# Patient Record
Sex: Female | Born: 1970
Health system: Southern US, Community
[De-identification: ages and names within clinical notes are randomized; demographics above are authoritative.]

## PROBLEM LIST (undated history)

## (undated) DIAGNOSIS — D219 Benign neoplasm of connective and other soft tissue, unspecified: Secondary | ICD-10-CM

## (undated) DIAGNOSIS — I10 Essential (primary) hypertension: Secondary | ICD-10-CM

## (undated) HISTORY — PX: HERNIA REPAIR: SHX51

## (undated) HISTORY — DX: Essential (primary) hypertension: I10

## (undated) HISTORY — PX: TUBAL LIGATION: SHX77

## (undated) HISTORY — DX: Benign neoplasm of connective and other soft tissue, unspecified: D21.9

---

## 2004-09-28 ENCOUNTER — Ambulatory Visit: Payer: Self-pay | Admitting: Unknown Physician Specialty

## 2007-10-28 ENCOUNTER — Ambulatory Visit: Payer: Self-pay | Admitting: Unknown Physician Specialty

## 2007-11-06 ENCOUNTER — Ambulatory Visit: Payer: Self-pay | Admitting: Unknown Physician Specialty

## 2009-05-13 ENCOUNTER — Encounter: Admission: RE | Admit: 2009-05-13 | Discharge: 2009-05-13 | Payer: Self-pay | Admitting: Unknown Physician Specialty

## 2009-12-15 ENCOUNTER — Encounter: Payer: Self-pay | Admitting: Obstetrics and Gynecology

## 2010-05-15 ENCOUNTER — Encounter: Admission: RE | Admit: 2010-05-15 | Discharge: 2010-05-15 | Payer: Self-pay | Admitting: Unknown Physician Specialty

## 2011-01-18 ENCOUNTER — Observation Stay: Payer: Self-pay

## 2011-01-21 ENCOUNTER — Observation Stay: Payer: Self-pay | Admitting: Obstetrics and Gynecology

## 2011-01-29 ENCOUNTER — Inpatient Hospital Stay: Payer: Self-pay | Admitting: Obstetrics and Gynecology

## 2011-02-01 LAB — PATHOLOGY REPORT

## 2011-04-10 ENCOUNTER — Other Ambulatory Visit: Payer: Self-pay | Admitting: Unknown Physician Specialty

## 2011-04-10 DIAGNOSIS — Z1231 Encounter for screening mammogram for malignant neoplasm of breast: Secondary | ICD-10-CM

## 2011-05-17 ENCOUNTER — Ambulatory Visit
Admission: RE | Admit: 2011-05-17 | Discharge: 2011-05-17 | Disposition: A | Discharge status: Home / Self Care | Payer: BC Managed Care – PPO | Source: Ambulatory Visit | Attending: Unknown Physician Specialty | Admitting: Unknown Physician Specialty

## 2011-05-17 DIAGNOSIS — Z1231 Encounter for screening mammogram for malignant neoplasm of breast: Secondary | ICD-10-CM

## 2011-05-24 ENCOUNTER — Other Ambulatory Visit: Payer: Self-pay | Admitting: Unknown Physician Specialty

## 2011-05-24 DIAGNOSIS — R928 Other abnormal and inconclusive findings on diagnostic imaging of breast: Secondary | ICD-10-CM

## 2011-06-06 ENCOUNTER — Ambulatory Visit
Admission: RE | Admit: 2011-06-06 | Discharge: 2011-06-06 | Disposition: A | Discharge status: Home / Self Care | Payer: BC Managed Care – PPO | Source: Ambulatory Visit | Attending: Unknown Physician Specialty | Admitting: Unknown Physician Specialty

## 2011-06-06 DIAGNOSIS — R928 Other abnormal and inconclusive findings on diagnostic imaging of breast: Secondary | ICD-10-CM

## 2012-04-28 ENCOUNTER — Other Ambulatory Visit: Payer: Self-pay | Admitting: Unknown Physician Specialty

## 2012-04-28 DIAGNOSIS — Z1231 Encounter for screening mammogram for malignant neoplasm of breast: Secondary | ICD-10-CM

## 2012-05-19 ENCOUNTER — Ambulatory Visit
Admission: RE | Admit: 2012-05-19 | Discharge: 2012-05-19 | Disposition: A | Discharge status: Home / Self Care | Payer: BC Managed Care – PPO | Source: Ambulatory Visit | Attending: Unknown Physician Specialty | Admitting: Unknown Physician Specialty

## 2012-05-19 DIAGNOSIS — Z1231 Encounter for screening mammogram for malignant neoplasm of breast: Secondary | ICD-10-CM

## 2013-03-24 ENCOUNTER — Other Ambulatory Visit: Payer: Self-pay

## 2013-03-24 DIAGNOSIS — Z1231 Encounter for screening mammogram for malignant neoplasm of breast: Secondary | ICD-10-CM

## 2013-05-20 ENCOUNTER — Ambulatory Visit
Admission: RE | Admit: 2013-05-20 | Discharge: 2013-05-20 | Disposition: A | Discharge status: Home / Self Care | Payer: BC Managed Care – PPO | Source: Ambulatory Visit

## 2013-05-20 DIAGNOSIS — Z1231 Encounter for screening mammogram for malignant neoplasm of breast: Secondary | ICD-10-CM

## 2014-04-14 ENCOUNTER — Other Ambulatory Visit: Payer: Self-pay

## 2014-04-14 DIAGNOSIS — Z1231 Encounter for screening mammogram for malignant neoplasm of breast: Secondary | ICD-10-CM

## 2014-05-25 ENCOUNTER — Ambulatory Visit
Admission: RE | Admit: 2014-05-25 | Discharge: 2014-05-25 | Disposition: A | Discharge status: Home / Self Care | Payer: BC Managed Care – PPO | Source: Ambulatory Visit

## 2014-05-25 DIAGNOSIS — Z1231 Encounter for screening mammogram for malignant neoplasm of breast: Secondary | ICD-10-CM

## 2015-04-22 ENCOUNTER — Other Ambulatory Visit: Payer: Self-pay

## 2015-04-22 DIAGNOSIS — Z1231 Encounter for screening mammogram for malignant neoplasm of breast: Secondary | ICD-10-CM

## 2015-05-30 ENCOUNTER — Ambulatory Visit
Admission: RE | Admit: 2015-05-30 | Discharge: 2015-05-30 | Disposition: A | Discharge status: Home / Self Care | Payer: BLUE CROSS/BLUE SHIELD | Source: Ambulatory Visit

## 2015-05-30 DIAGNOSIS — Z1231 Encounter for screening mammogram for malignant neoplasm of breast: Secondary | ICD-10-CM

## 2015-06-01 ENCOUNTER — Other Ambulatory Visit: Payer: Self-pay | Admitting: Internal Medicine

## 2015-06-01 DIAGNOSIS — R928 Other abnormal and inconclusive findings on diagnostic imaging of breast: Secondary | ICD-10-CM

## 2015-06-03 ENCOUNTER — Other Ambulatory Visit: Payer: Self-pay | Admitting: Unknown Physician Specialty

## 2015-06-03 DIAGNOSIS — R928 Other abnormal and inconclusive findings on diagnostic imaging of breast: Secondary | ICD-10-CM

## 2015-06-07 ENCOUNTER — Other Ambulatory Visit: Payer: BLUE CROSS/BLUE SHIELD

## 2015-06-28 ENCOUNTER — Ambulatory Visit
Admission: RE | Admit: 2015-06-28 | Discharge: 2015-06-28 | Disposition: A | Discharge status: Home / Self Care | Payer: BLUE CROSS/BLUE SHIELD | Source: Ambulatory Visit | Attending: Unknown Physician Specialty | Admitting: Unknown Physician Specialty

## 2015-06-28 DIAGNOSIS — R928 Other abnormal and inconclusive findings on diagnostic imaging of breast: Secondary | ICD-10-CM

## 2015-11-22 ENCOUNTER — Other Ambulatory Visit: Payer: Self-pay | Admitting: Unknown Physician Specialty

## 2015-11-22 DIAGNOSIS — N6320 Unspecified lump in the left breast, unspecified quadrant: Secondary | ICD-10-CM

## 2015-12-27 ENCOUNTER — Other Ambulatory Visit: Payer: BLUE CROSS/BLUE SHIELD

## 2016-01-04 ENCOUNTER — Ambulatory Visit
Admission: RE | Admit: 2016-01-04 | Discharge: 2016-01-04 | Disposition: A | Discharge status: Home / Self Care | Payer: BLUE CROSS/BLUE SHIELD | Source: Ambulatory Visit | Attending: Unknown Physician Specialty | Admitting: Unknown Physician Specialty

## 2016-01-04 DIAGNOSIS — R928 Other abnormal and inconclusive findings on diagnostic imaging of breast: Secondary | ICD-10-CM | POA: Diagnosis not present

## 2016-01-04 DIAGNOSIS — N6320 Unspecified lump in the left breast, unspecified quadrant: Secondary | ICD-10-CM

## 2016-01-04 DIAGNOSIS — N63 Unspecified lump in breast: Secondary | ICD-10-CM | POA: Diagnosis not present

## 2016-03-28 DIAGNOSIS — Z01419 Encounter for gynecological examination (general) (routine) without abnormal findings: Secondary | ICD-10-CM | POA: Diagnosis not present

## 2016-04-20 DIAGNOSIS — J029 Acute pharyngitis, unspecified: Secondary | ICD-10-CM | POA: Diagnosis not present

## 2016-05-07 ENCOUNTER — Other Ambulatory Visit: Payer: Self-pay | Admitting: Obstetrics & Gynecology

## 2016-05-07 DIAGNOSIS — N6320 Unspecified lump in the left breast, unspecified quadrant: Secondary | ICD-10-CM

## 2016-05-30 ENCOUNTER — Other Ambulatory Visit: Payer: BLUE CROSS/BLUE SHIELD

## 2016-06-12 ENCOUNTER — Ambulatory Visit
Admission: RE | Admit: 2016-06-12 | Discharge: 2016-06-12 | Disposition: A | Discharge status: Home / Self Care | Payer: BLUE CROSS/BLUE SHIELD | Source: Ambulatory Visit | Attending: Obstetrics & Gynecology | Admitting: Obstetrics & Gynecology

## 2016-06-12 DIAGNOSIS — N6320 Unspecified lump in the left breast, unspecified quadrant: Secondary | ICD-10-CM

## 2016-06-12 DIAGNOSIS — N6012 Diffuse cystic mastopathy of left breast: Secondary | ICD-10-CM | POA: Diagnosis not present

## 2016-10-11 DIAGNOSIS — D485 Neoplasm of uncertain behavior of skin: Secondary | ICD-10-CM | POA: Diagnosis not present

## 2016-10-11 DIAGNOSIS — D225 Melanocytic nevi of trunk: Secondary | ICD-10-CM | POA: Diagnosis not present

## 2016-10-11 DIAGNOSIS — D0359 Melanoma in situ of other part of trunk: Secondary | ICD-10-CM | POA: Diagnosis not present

## 2016-10-29 DIAGNOSIS — L905 Scar conditions and fibrosis of skin: Secondary | ICD-10-CM | POA: Diagnosis not present

## 2016-10-29 DIAGNOSIS — D0359 Melanoma in situ of other part of trunk: Secondary | ICD-10-CM | POA: Diagnosis not present

## 2017-02-08 DIAGNOSIS — Z85820 Personal history of malignant melanoma of skin: Secondary | ICD-10-CM | POA: Diagnosis not present

## 2017-03-15 DIAGNOSIS — D0372 Melanoma in situ of left lower limb, including hip: Secondary | ICD-10-CM | POA: Diagnosis not present

## 2017-03-15 DIAGNOSIS — D485 Neoplasm of uncertain behavior of skin: Secondary | ICD-10-CM | POA: Diagnosis not present

## 2017-04-01 ENCOUNTER — Encounter: Payer: Self-pay | Admitting: Obstetrics & Gynecology

## 2017-04-01 ENCOUNTER — Ambulatory Visit (INDEPENDENT_AMBULATORY_CARE_PROVIDER_SITE_OTHER): Payer: BLUE CROSS/BLUE SHIELD | Admitting: Obstetrics & Gynecology

## 2017-04-01 ENCOUNTER — Ambulatory Visit: Payer: Self-pay | Admitting: Obstetrics & Gynecology

## 2017-04-01 VITALS — BP 138/88 | Ht 66.0 in | Wt 170.0 lb

## 2017-04-01 DIAGNOSIS — Z803 Family history of malignant neoplasm of breast: Secondary | ICD-10-CM | POA: Diagnosis not present

## 2017-04-01 DIAGNOSIS — Z01419 Encounter for gynecological examination (general) (routine) without abnormal findings: Secondary | ICD-10-CM | POA: Diagnosis not present

## 2017-04-01 DIAGNOSIS — Z0000 Encounter for general adult medical examination without abnormal findings: Secondary | ICD-10-CM

## 2017-04-01 DIAGNOSIS — Z8041 Family history of malignant neoplasm of ovary: Secondary | ICD-10-CM | POA: Diagnosis not present

## 2017-04-01 NOTE — Progress Notes (Signed)
   HPI:      [name] is a 46 y.o. No obstetric history on file. who LMP was Patient's last menstrual period was 03/27/2017., she presents today for her annual examination. The patient has no complaints today. The patient is sexually active. Her last pap: approximate date 2016 and was normal and last mammogram: 12/2016 and 06/2016 being followed for slight abnormalities. The patient does perform self breast exams.  There is no notable family history of breast or ovarian cancer in her family.  The patient has regular exercise: yes.  The patient denies current symptoms of depression.    GYN History: Contraception: tubal ligation  PMHx: No past medical history on file. No past surgical history on file. Family History  Problem Relation Age of Onset  . Breast cancer Mother 95  . Ovarian cancer Paternal Aunt 40   Social History  Substance Use Topics  . Smoking status: Never Smoker  . Smokeless tobacco: Never Used  . Alcohol use Not on file   No current outpatient prescriptions on file. Allergies: Codeine  ROS  Objective: BP 138/88   Ht 5\' 6"  (1.676 m)   Wt 170 lb (77.1 kg)   LMP 03/27/2017   BMI 27.44 kg/m   Filed Weights   04/01/17 1550  Weight: 170 lb (77.1 kg)   Body mass index is 27.44 kg/m. OBGyn Exam  Assessment:  ANNUAL EXAM 1. Annual physical exam   2. Family history of breast cancer   3. Family history of ovarian cancer      Screening Plan:            1.  Cervical Screening-  Pap smear schedule reviewed with patient  2. Breast screening- Exam annually and mammogram>40 planned   3. Colonoscopy every 10 years, Hemoccult testing - after age 14  4. Labs due 2020  5. Counseling for contraception: bilateral tubal ligation  Other:  1. Family history of breast cancer MMG per breast center due to f/u for mast years abnormality  2. Family history of ovarian cancer     F/U  Return in about 1 year (around 04/01/2018) for Annual.  Cassidy Applebaum, MD,  [facility], [facility] 04/01/2017  4:16 PM

## 2017-04-01 NOTE — Patient Instructions (Signed)
PAP every three years Mammogram every year Labs every three years

## 2017-04-30 ENCOUNTER — Other Ambulatory Visit: Payer: Self-pay | Admitting: Obstetrics & Gynecology

## 2017-04-30 DIAGNOSIS — Z1231 Encounter for screening mammogram for malignant neoplasm of breast: Secondary | ICD-10-CM

## 2017-05-29 DIAGNOSIS — L905 Scar conditions and fibrosis of skin: Secondary | ICD-10-CM | POA: Diagnosis not present

## 2017-05-29 DIAGNOSIS — D0372 Melanoma in situ of left lower limb, including hip: Secondary | ICD-10-CM | POA: Diagnosis not present

## 2017-06-14 ENCOUNTER — Ambulatory Visit
Admission: RE | Admit: 2017-06-14 | Discharge: 2017-06-14 | Disposition: A | Discharge status: Home / Self Care | Payer: BLUE CROSS/BLUE SHIELD | Source: Ambulatory Visit | Attending: Obstetrics & Gynecology | Admitting: Obstetrics & Gynecology

## 2017-06-14 DIAGNOSIS — Z1231 Encounter for screening mammogram for malignant neoplasm of breast: Secondary | ICD-10-CM | POA: Diagnosis not present

## 2017-07-20 DIAGNOSIS — Z23 Encounter for immunization: Secondary | ICD-10-CM | POA: Diagnosis not present

## 2017-08-29 DIAGNOSIS — Z85820 Personal history of malignant melanoma of skin: Secondary | ICD-10-CM | POA: Diagnosis not present

## 2018-01-29 ENCOUNTER — Encounter: Payer: Self-pay | Admitting: Obstetrics & Gynecology

## 2018-04-04 ENCOUNTER — Other Ambulatory Visit (HOSPITAL_COMMUNITY)
Admission: RE | Admit: 2018-04-04 | Discharge: 2018-04-04 | Disposition: A | Discharge status: Home / Self Care | Payer: BLUE CROSS/BLUE SHIELD | Source: Ambulatory Visit | Attending: Obstetrics & Gynecology | Admitting: Obstetrics & Gynecology

## 2018-04-04 ENCOUNTER — Ambulatory Visit (INDEPENDENT_AMBULATORY_CARE_PROVIDER_SITE_OTHER): Payer: BLUE CROSS/BLUE SHIELD | Admitting: Obstetrics & Gynecology

## 2018-04-04 ENCOUNTER — Ambulatory Visit: Payer: BLUE CROSS/BLUE SHIELD | Admitting: Obstetrics & Gynecology

## 2018-04-04 ENCOUNTER — Other Ambulatory Visit: Payer: Self-pay | Admitting: Obstetrics & Gynecology

## 2018-04-04 ENCOUNTER — Encounter: Payer: Self-pay | Admitting: Obstetrics & Gynecology

## 2018-04-04 VITALS — BP 130/80 | Ht 66.0 in | Wt 170.0 lb

## 2018-04-04 DIAGNOSIS — Z1329 Encounter for screening for other suspected endocrine disorder: Secondary | ICD-10-CM

## 2018-04-04 DIAGNOSIS — Z124 Encounter for screening for malignant neoplasm of cervix: Secondary | ICD-10-CM | POA: Insufficient documentation

## 2018-04-04 DIAGNOSIS — Z0000 Encounter for general adult medical examination without abnormal findings: Secondary | ICD-10-CM

## 2018-04-04 DIAGNOSIS — Z1321 Encounter for screening for nutritional disorder: Secondary | ICD-10-CM

## 2018-04-04 DIAGNOSIS — Z131 Encounter for screening for diabetes mellitus: Secondary | ICD-10-CM

## 2018-04-04 DIAGNOSIS — Z1239 Encounter for other screening for malignant neoplasm of breast: Secondary | ICD-10-CM

## 2018-04-04 DIAGNOSIS — Z1231 Encounter for screening mammogram for malignant neoplasm of breast: Secondary | ICD-10-CM | POA: Diagnosis not present

## 2018-04-04 DIAGNOSIS — Z13220 Encounter for screening for lipoid disorders: Secondary | ICD-10-CM | POA: Diagnosis not present

## 2018-04-04 MED ORDER — MELOXICAM 7.5 MG PO TABS: 7.5000 mg | ORAL_TABLET | Freq: Every day | ORAL | 6 refills | Status: DC

## 2018-04-04 MED ORDER — MELOXICAM 7.5 MG PO TABS: 7.5000 mg | ORAL_TABLET | Freq: Four times a day (QID) | ORAL | 6 refills | Status: DC | PRN

## 2018-04-04 NOTE — Patient Instructions (Addendum)
PAP every three years Mammogram every year    Due NOV at [facility] soon

## 2018-04-04 NOTE — Progress Notes (Signed)
HPI:      [name] is a 47 y.o. [id number] who LMP was Patient's last menstrual period was 03/25/2018., she presents today for her annual examination. The patient has no complaints today.  She has a one day premenstrual headache w no assoc sx's. The patient is sexually active. Her last pap: approximate date 2016 and was normal and last mammogram: was normal. The patient does perform self breast exams.  There is no notable family history of breast or ovarian cancer in her family.  The patient has regular exercise: yes.  The patient denies current symptoms of depression.    GYN History: Contraception: tubal ligation  PMHx: Past Medical History:  Diagnosis Date  . Fibroids   . Hypertension    Past Surgical History:  Procedure Laterality Date  . CESAREAN SECTION    . HERNIA REPAIR    . TUBAL LIGATION     Family History  Problem Relation Age of Onset  . Breast cancer Mother 20  . Ovarian cancer Paternal Aunt 59   Social History   Tobacco Use  . Smoking status: Never Smoker  . Smokeless tobacco: Never Used  Substance Use Topics  . Alcohol use: Never    Frequency: Never  . Drug use: Never   No current outpatient medications on file. Allergies: Codeine  Review of Systems  Constitutional: Negative for chills, fever and malaise/fatigue.  HENT: Negative for congestion, sinus pain and sore throat.   Eyes: Negative for blurred vision and pain.  Respiratory: Negative for cough and wheezing.   Cardiovascular: Negative for chest pain and leg swelling.  Gastrointestinal: Negative for abdominal pain, constipation, diarrhea, heartburn, nausea and vomiting.  Genitourinary: Negative for dysuria, frequency, hematuria and urgency.  Musculoskeletal: Negative for back pain, joint pain, myalgias and neck pain.  Skin: Negative for itching and rash.  Neurological: Negative for dizziness, tremors and weakness.  Endo/Heme/Allergies: Does not bruise/bleed easily.  Psychiatric/Behavioral:  Negative for depression. The patient is not nervous/anxious and does not have insomnia.     Objective: BP 130/80   Ht 5\' 6"  (1.676 m)   Wt 170 lb (77.1 kg)   LMP 03/25/2018   BMI 27.44 kg/m   Filed Weights   04/04/18 1322  Weight: 170 lb (77.1 kg)   Body mass index is 27.44 kg/m. Physical Exam  Constitutional: She is oriented to person, place, and time. She appears well-developed and well-nourished. No distress.  Genitourinary: Rectum normal, vagina normal and uterus normal. Pelvic exam was performed with patient supine. There is no rash or lesion on the right labia. There is no rash or lesion on the left labia. Vagina exhibits no lesion. No bleeding in the vagina. Right adnexum does not display mass and does not display tenderness. Left adnexum does not display mass and does not display tenderness. Cervix does not exhibit motion tenderness, lesion, friability or polyp.   Uterus is mobile and midaxial. Uterus is not enlarged or exhibiting a mass.  HENT:  Head: Normocephalic and atraumatic. Head is without laceration.  Right Ear: Hearing normal.  Left Ear: Hearing normal.  Nose: No epistaxis.  No foreign bodies.  Mouth/Throat: Uvula is midline, oropharynx is clear and moist and mucous membranes are normal.  Eyes: Pupils are equal, round, and reactive to light.  Neck: Normal range of motion. Neck supple. No thyromegaly present.  Cardiovascular: Normal rate and regular rhythm. Exam reveals no gallop and no friction rub.  No murmur heard. Pulmonary/Chest: Effort normal and breath sounds  normal. No respiratory distress. She has no wheezes. Right breast exhibits no mass, no skin change and no tenderness. Left breast exhibits no mass, no skin change and no tenderness.  Abdominal: Soft. Bowel sounds are normal. She exhibits no distension. There is no tenderness. There is no rebound.  Musculoskeletal: Normal range of motion.  Neurological: She is alert and oriented to person, place, and time.  No cranial nerve deficit.  Skin: Skin is warm and dry.  Psychiatric: She has a normal mood and affect. Judgment normal.  Vitals reviewed.   Assessment:  ANNUAL EXAM 1. Annual physical exam   2. Screening for cervical cancer   3. Screening for breast cancer   4. Screening for cholesterol level   5. Screening for thyroid disorder   6. Screening for diabetes mellitus   7. Encounter for vitamin deficiency screening    Screening Plan:            1.  Cervical Screening-  Pap smear done today  2. Breast screening- Exam annually and mammogram>40 planned   3. Colonoscopy every 10 years, Hemoccult testing - after age 40  4. Labs To return fasting at a later date  5. Counseling for contraception: bilateral tubal ligation   6. Premenstrual Headache.  NSAID or Rx Mobic (Rx today). Hormonal options discussed.    F/U  Return in about 1 year (around 04/05/2019) for Annual.  Barnett Applebaum, MD, [facility], [facility] 04/04/2018  1:41 PM

## 2018-04-08 ENCOUNTER — Encounter: Payer: Self-pay | Admitting: Obstetrics and Gynecology

## 2018-04-09 LAB — CYTOLOGY - PAP
Diagnosis: NEGATIVE
HPV (WINDOPATH): NOT DETECTED

## 2018-04-11 ENCOUNTER — Other Ambulatory Visit: Payer: BLUE CROSS/BLUE SHIELD

## 2018-04-11 ENCOUNTER — Other Ambulatory Visit: Payer: Self-pay | Admitting: Obstetrics & Gynecology

## 2018-04-11 DIAGNOSIS — Z1321 Encounter for screening for nutritional disorder: Secondary | ICD-10-CM | POA: Diagnosis not present

## 2018-04-11 DIAGNOSIS — Z1329 Encounter for screening for other suspected endocrine disorder: Secondary | ICD-10-CM

## 2018-04-11 DIAGNOSIS — Z13220 Encounter for screening for lipoid disorders: Secondary | ICD-10-CM

## 2018-04-11 DIAGNOSIS — Z131 Encounter for screening for diabetes mellitus: Secondary | ICD-10-CM

## 2018-04-12 LAB — LIPID PANEL
Chol/HDL Ratio: 2.7 ratio (ref 0.0–4.4)
Cholesterol, Total: 158 mg/dL (ref 100–199)
HDL: 58 mg/dL (ref >39)
LDL Calculated: 90 mg/dL (ref 0–99)
TRIGLYCERIDES: 51 mg/dL (ref 0–149)
VLDL Cholesterol Cal: 10 mg/dL (ref 5–40)

## 2018-04-12 LAB — HEMOGLOBIN A1C
Est. average glucose Bld gHb Est-mCnc: 117 mg/dL
HEMOGLOBIN A1C: 5.7 % — AB (ref 4.8–5.6)

## 2018-04-12 LAB — TSH: TSH: 1.05 u[IU]/mL (ref 0.450–4.500)

## 2018-04-12 LAB — VITAMIN D 25 HYDROXY (VIT D DEFICIENCY, FRACTURES): Vit D, 25-Hydroxy: 45.5 ng/mL (ref 30.0–100.0)

## 2018-06-20 ENCOUNTER — Ambulatory Visit
Admission: RE | Admit: 2018-06-20 | Discharge: 2018-06-20 | Disposition: A | Discharge status: Home / Self Care | Payer: BLUE CROSS/BLUE SHIELD | Source: Ambulatory Visit | Attending: Obstetrics & Gynecology | Admitting: Obstetrics & Gynecology

## 2018-06-20 DIAGNOSIS — Z1231 Encounter for screening mammogram for malignant neoplasm of breast: Secondary | ICD-10-CM | POA: Diagnosis not present

## 2018-06-24 ENCOUNTER — Other Ambulatory Visit: Payer: Self-pay | Admitting: Obstetrics & Gynecology

## 2018-06-24 DIAGNOSIS — R928 Other abnormal and inconclusive findings on diagnostic imaging of breast: Secondary | ICD-10-CM

## 2018-06-27 ENCOUNTER — Ambulatory Visit
Admission: RE | Admit: 2018-06-27 | Discharge: 2018-06-27 | Disposition: A | Discharge status: Home / Self Care | Payer: BLUE CROSS/BLUE SHIELD | Source: Ambulatory Visit | Attending: Obstetrics & Gynecology | Admitting: Obstetrics & Gynecology

## 2018-06-27 DIAGNOSIS — N6011 Diffuse cystic mastopathy of right breast: Secondary | ICD-10-CM | POA: Diagnosis not present

## 2018-06-27 DIAGNOSIS — R928 Other abnormal and inconclusive findings on diagnostic imaging of breast: Secondary | ICD-10-CM

## 2018-06-27 DIAGNOSIS — N6012 Diffuse cystic mastopathy of left breast: Secondary | ICD-10-CM | POA: Diagnosis not present

## 2018-06-27 DIAGNOSIS — R922 Inconclusive mammogram: Secondary | ICD-10-CM | POA: Diagnosis not present

## 2018-07-18 DIAGNOSIS — Z23 Encounter for immunization: Secondary | ICD-10-CM | POA: Diagnosis not present

## 2018-07-26 DIAGNOSIS — J0140 Acute pansinusitis, unspecified: Secondary | ICD-10-CM | POA: Diagnosis not present

## 2018-08-07 DIAGNOSIS — J309 Allergic rhinitis, unspecified: Secondary | ICD-10-CM | POA: Diagnosis not present

## 2018-08-07 DIAGNOSIS — J019 Acute sinusitis, unspecified: Secondary | ICD-10-CM | POA: Diagnosis not present

## 2018-09-11 DIAGNOSIS — D2261 Melanocytic nevi of right upper limb, including shoulder: Secondary | ICD-10-CM | POA: Diagnosis not present

## 2018-09-11 DIAGNOSIS — Z85820 Personal history of malignant melanoma of skin: Secondary | ICD-10-CM | POA: Diagnosis not present

## 2018-09-11 DIAGNOSIS — D2262 Melanocytic nevi of left upper limb, including shoulder: Secondary | ICD-10-CM | POA: Diagnosis not present

## 2018-09-11 DIAGNOSIS — D2272 Melanocytic nevi of left lower limb, including hip: Secondary | ICD-10-CM | POA: Diagnosis not present

## 2018-09-22 ENCOUNTER — Ambulatory Visit: Payer: BLUE CROSS/BLUE SHIELD | Admitting: Nurse Practitioner

## 2018-09-22 DIAGNOSIS — J069 Acute upper respiratory infection, unspecified: Secondary | ICD-10-CM | POA: Diagnosis not present

## 2019-03-20 DIAGNOSIS — D2271 Melanocytic nevi of right lower limb, including hip: Secondary | ICD-10-CM | POA: Diagnosis not present

## 2019-03-20 DIAGNOSIS — D2261 Melanocytic nevi of right upper limb, including shoulder: Secondary | ICD-10-CM | POA: Diagnosis not present

## 2019-03-20 DIAGNOSIS — Z85820 Personal history of malignant melanoma of skin: Secondary | ICD-10-CM | POA: Diagnosis not present

## 2019-03-20 DIAGNOSIS — D2262 Melanocytic nevi of left upper limb, including shoulder: Secondary | ICD-10-CM | POA: Diagnosis not present

## 2019-04-23 DIAGNOSIS — H6123 Impacted cerumen, bilateral: Secondary | ICD-10-CM | POA: Diagnosis not present

## 2019-04-30 ENCOUNTER — Ambulatory Visit: Payer: BLUE CROSS/BLUE SHIELD | Admitting: Obstetrics & Gynecology

## 2019-04-30 DIAGNOSIS — H93299 Other abnormal auditory perceptions, unspecified ear: Secondary | ICD-10-CM | POA: Diagnosis not present

## 2019-04-30 DIAGNOSIS — H6123 Impacted cerumen, bilateral: Secondary | ICD-10-CM | POA: Diagnosis not present

## 2019-05-15 ENCOUNTER — Other Ambulatory Visit: Payer: Self-pay | Admitting: Obstetrics & Gynecology

## 2019-05-15 DIAGNOSIS — Z1231 Encounter for screening mammogram for malignant neoplasm of breast: Secondary | ICD-10-CM

## 2019-05-19 ENCOUNTER — Ambulatory Visit: Payer: BC Managed Care – PPO | Admitting: Obstetrics & Gynecology

## 2019-06-03 DIAGNOSIS — H6063 Unspecified chronic otitis externa, bilateral: Secondary | ICD-10-CM | POA: Diagnosis not present

## 2019-06-03 DIAGNOSIS — H60333 Swimmer's ear, bilateral: Secondary | ICD-10-CM | POA: Diagnosis not present

## 2019-06-05 DIAGNOSIS — H6061 Unspecified chronic otitis externa, right ear: Secondary | ICD-10-CM | POA: Diagnosis not present

## 2019-06-05 DIAGNOSIS — H60331 Swimmer's ear, right ear: Secondary | ICD-10-CM | POA: Diagnosis not present

## 2019-06-12 ENCOUNTER — Other Ambulatory Visit: Payer: Self-pay

## 2019-06-12 ENCOUNTER — Encounter: Payer: Self-pay | Admitting: Obstetrics & Gynecology

## 2019-06-12 ENCOUNTER — Ambulatory Visit (INDEPENDENT_AMBULATORY_CARE_PROVIDER_SITE_OTHER): Payer: BC Managed Care – PPO | Admitting: Obstetrics & Gynecology

## 2019-06-12 VITALS — BP 122/80 | Ht 66.0 in | Wt 162.0 lb

## 2019-06-12 DIAGNOSIS — Z1321 Encounter for screening for nutritional disorder: Secondary | ICD-10-CM

## 2019-06-12 DIAGNOSIS — Z13220 Encounter for screening for lipoid disorders: Secondary | ICD-10-CM

## 2019-06-12 DIAGNOSIS — Z131 Encounter for screening for diabetes mellitus: Secondary | ICD-10-CM

## 2019-06-12 DIAGNOSIS — Z1329 Encounter for screening for other suspected endocrine disorder: Secondary | ICD-10-CM

## 2019-06-12 DIAGNOSIS — Z01419 Encounter for gynecological examination (general) (routine) without abnormal findings: Secondary | ICD-10-CM | POA: Diagnosis not present

## 2019-06-12 DIAGNOSIS — Z1231 Encounter for screening mammogram for malignant neoplasm of breast: Secondary | ICD-10-CM

## 2019-06-12 DIAGNOSIS — H60333 Swimmer's ear, bilateral: Secondary | ICD-10-CM | POA: Diagnosis not present

## 2019-06-12 DIAGNOSIS — H6063 Unspecified chronic otitis externa, bilateral: Secondary | ICD-10-CM | POA: Diagnosis not present

## 2019-06-12 MED ORDER — MELOXICAM 7.5 MG PO TABS: 7.5000 mg | ORAL_TABLET | Freq: Two times a day (BID) | ORAL | 6 refills | Status: DC | PRN

## 2019-06-12 NOTE — Patient Instructions (Signed)
PAP every three years Mammogram every year Labs yearly

## 2019-06-12 NOTE — Progress Notes (Signed)
   HPI:      [name] is a 48 y.o. [id number] who LMP was Patient's last menstrual period was 05/13/2019., she presents today for her annual examination. The patient has no complaints today. Still has reg periods and does have menstrual migraines requiring Meloxicam at times.  The patient is sexually active. Her last pap: approximate date 2019 and was normal and last mammogram: approximate date 2019 and was normal. The patient does perform self breast exams.  There is no notable family history of breast or ovarian cancer in her family.  The patient has regular exercise: yes.  The patient denies current symptoms of depression.    GYN History: Contraception: tubal ligation  PMHx: Past Medical History:  Diagnosis Date  . Fibroids   . Hypertension    Past Surgical History:  Procedure Laterality Date  . CESAREAN SECTION    . HERNIA REPAIR    . TUBAL LIGATION     Family History  Problem Relation Age of Onset  . Breast cancer Mother 46  . Uterine cancer Paternal Aunt 8   Social History   Tobacco Use  . Smoking status: Never Smoker  . Smokeless tobacco: Never Used  Substance Use Topics  . Alcohol use: Never    Frequency: Never  . Drug use: Never    Current Outpatient Medications:  .  meloxicam (MOBIC) 7.5 MG tablet, Take 1 tablet (7.5 mg total) by mouth every 12 (twelve) hours as needed for pain., Disp: 20 tablet, Rfl: 6 Allergies: Codeine  ROS  Objective: BP 122/80   Ht 5\' 6"  (1.676 m)   Wt 162 lb (73.5 kg)   LMP 05/13/2019   BMI 26.15 kg/m   Filed Weights   06/12/19 0845  Weight: 162 lb (73.5 kg)   Body mass index is 26.15 kg/m. OBGyn Exam  Assessment:  ANNUAL EXAM 1. Women's annual routine gynecological examination   2. Encounter for screening mammogram for malignant neoplasm of breast   3. Screening for cholesterol level   4. Screening for thyroid disorder   5. Encounter for vitamin deficiency screening   6. Screening for diabetes mellitus      Screening Plan:            1.  Cervical Screening-  Pap smear schedule reviewed with patient  2. Breast screening- Exam annually and mammogram>40 planned   3. Colonoscopy every 10 years, Hemoccult testing - after age 48  4. Labs Ordered today  5. Counseling for contraception: bilateral tubal ligation   6. Meloxicam for menstrual migraines    F/U  Return in about 1 year (around 06/11/2020) for Annual.  Barnett Applebaum, MD, [facility], [facility] 06/12/2019  9:08 AM

## 2019-06-13 LAB — LIPID PANEL
Chol/HDL Ratio: 3.4 ratio (ref 0.0–4.4)
Cholesterol, Total: 172 mg/dL (ref 100–199)
HDL: 51 mg/dL (ref >39)
LDL Chol Calc (NIH): 110 mg/dL — ABNORMAL HIGH (ref 0–99)
Triglycerides: 57 mg/dL (ref 0–149)
VLDL Cholesterol Cal: 11 mg/dL (ref 5–40)

## 2019-06-13 LAB — HEMOGLOBIN A1C
Est. average glucose Bld gHb Est-mCnc: 117 mg/dL
Hgb A1c MFr Bld: 5.7 % — ABNORMAL HIGH (ref 4.8–5.6)

## 2019-06-13 LAB — TSH: TSH: 0.853 u[IU]/mL (ref 0.450–4.500)

## 2019-06-13 LAB — VITAMIN D 25 HYDROXY (VIT D DEFICIENCY, FRACTURES): Vit D, 25-Hydroxy: 38.4 ng/mL (ref 30.0–100.0)

## 2019-06-30 ENCOUNTER — Ambulatory Visit
Admission: RE | Admit: 2019-06-30 | Discharge: 2019-06-30 | Disposition: A | Discharge status: Home / Self Care | Payer: BC Managed Care – PPO | Source: Ambulatory Visit | Attending: Obstetrics & Gynecology | Admitting: Obstetrics & Gynecology

## 2019-06-30 ENCOUNTER — Other Ambulatory Visit: Payer: Self-pay

## 2019-06-30 DIAGNOSIS — Z1231 Encounter for screening mammogram for malignant neoplasm of breast: Secondary | ICD-10-CM | POA: Diagnosis not present

## 2020-05-17 ENCOUNTER — Other Ambulatory Visit: Payer: Self-pay | Admitting: Obstetrics & Gynecology

## 2020-05-17 DIAGNOSIS — Z1231 Encounter for screening mammogram for malignant neoplasm of breast: Secondary | ICD-10-CM

## 2020-06-14 ENCOUNTER — Ambulatory Visit (INDEPENDENT_AMBULATORY_CARE_PROVIDER_SITE_OTHER): Payer: BC Managed Care – PPO | Admitting: Obstetrics & Gynecology

## 2020-06-14 ENCOUNTER — Other Ambulatory Visit: Payer: Self-pay

## 2020-06-14 ENCOUNTER — Encounter: Payer: Self-pay | Admitting: Obstetrics & Gynecology

## 2020-06-14 VITALS — BP 130/90 | Ht 66.0 in | Wt 154.0 lb

## 2020-06-14 DIAGNOSIS — Z131 Encounter for screening for diabetes mellitus: Secondary | ICD-10-CM

## 2020-06-14 DIAGNOSIS — Z01419 Encounter for gynecological examination (general) (routine) without abnormal findings: Secondary | ICD-10-CM | POA: Diagnosis not present

## 2020-06-14 DIAGNOSIS — Z1329 Encounter for screening for other suspected endocrine disorder: Secondary | ICD-10-CM

## 2020-06-14 DIAGNOSIS — Z13220 Encounter for screening for lipoid disorders: Secondary | ICD-10-CM

## 2020-06-14 DIAGNOSIS — Z1231 Encounter for screening mammogram for malignant neoplasm of breast: Secondary | ICD-10-CM | POA: Diagnosis not present

## 2020-06-14 DIAGNOSIS — Z1321 Encounter for screening for nutritional disorder: Secondary | ICD-10-CM

## 2020-06-14 NOTE — Progress Notes (Signed)
HPI:      Ms. Reneisha Stilley is a 49 y.o. [id number] who LMP was Patient's last menstrual period was 05/25/2020., she presents today for her annual examination. The patient has no complaints today. The patient is sexually active. Her last pap: approximate date 2019 and was normal and last mammogram: approximate date 2020 and was normal. The patient does perform self breast exams.  There is no notable family history of breast or ovarian cancer in her family.  The patient has regular exercise: yes.  The patient denies current symptoms of depression.    GYN History: Contraception: tubal ligation  PMHx: Past Medical History:  Diagnosis Date  . Fibroids   . Hypertension    Past Surgical History:  Procedure Laterality Date  . CESAREAN SECTION    . HERNIA REPAIR    . TUBAL LIGATION     Family History  Problem Relation Age of Onset  . Breast cancer Mother 58  . Uterine cancer Paternal Aunt 31   Social History   Tobacco Use  . Smoking status: Never Smoker  . Smokeless tobacco: Never Used  Vaping Use  . Vaping Use: Never assessed  Substance Use Topics  . Alcohol use: Never  . Drug use: Never    Current Outpatient Medications:  .  meloxicam (MOBIC) 7.5 MG tablet, Take 1 tablet (7.5 mg total) by mouth every 12 (twelve) hours as needed for pain., Disp: 20 tablet, Rfl: 6 Allergies: Codeine  Review of Systems  Constitutional: Negative for chills, fever and malaise/fatigue.  HENT: Negative for congestion, sinus pain and sore throat.   Eyes: Negative for blurred vision and pain.  Respiratory: Negative for cough and wheezing.   Cardiovascular: Negative for chest pain and leg swelling.  Gastrointestinal: Negative for abdominal pain, constipation, diarrhea, heartburn, nausea and vomiting.  Genitourinary: Negative for dysuria, frequency, hematuria and urgency.  Musculoskeletal: Negative for back pain, joint pain, myalgias and neck pain.  Skin: Negative for itching and rash.    Neurological: Negative for dizziness, tremors and weakness.  Endo/Heme/Allergies: Does not bruise/bleed easily.  Psychiatric/Behavioral: Negative for depression. The patient is not nervous/anxious and does not have insomnia.     Objective: BP 130/90   Ht 5\' 6"  (1.676 m)   Wt 154 lb (69.9 kg)   LMP 05/25/2020   BMI 24.86 kg/m   Filed Weights   06/14/20 1417  Weight: 154 lb (69.9 kg)   Body mass index is 24.86 kg/m. Physical Exam Constitutional:      General: She is not in acute distress.    Appearance: She is well-developed.  Genitourinary:     Pelvic exam was performed with patient supine.     Urethra, bladder, vagina and rectum normal.     No lesions in the vagina.     No vaginal bleeding.     No cervical motion tenderness, friability, lesion or polyp.     Uterus is enlarged and mobile.     No uterine mass detected.    Uterus is midaxial.     No right or left adnexal mass present.     Right adnexa not tender.     Left adnexa not tender.     Genitourinary Comments: 8 weeks size  HENT:     Head: Normocephalic and atraumatic. No laceration.     Right Ear: Hearing normal.     Left Ear: Hearing normal.     Mouth/Throat:     Pharynx: Uvula midline.  Eyes:  Pupils: Pupils are equal, round, and reactive to light.  Neck:     Thyroid: No thyromegaly.  Cardiovascular:     Rate and Rhythm: Normal rate and regular rhythm.     Heart sounds: No murmur heard.  No friction rub. No gallop.   Pulmonary:     Effort: Pulmonary effort is normal. No respiratory distress.     Breath sounds: Normal breath sounds. No wheezing.  Chest:     Breasts:        Right: No mass, skin change or tenderness.        Left: No mass, skin change or tenderness.  Abdominal:     General: Bowel sounds are normal. There is no distension.     Palpations: Abdomen is soft.     Tenderness: There is no abdominal tenderness. There is no rebound.  Musculoskeletal:        General: Normal range of motion.      Cervical back: Normal range of motion and neck supple.  Neurological:     Mental Status: She is alert and oriented to person, place, and time.     Cranial Nerves: No cranial nerve deficit.  Skin:    General: Skin is warm and dry.  Psychiatric:        Judgment: Judgment normal.  Vitals reviewed.     Assessment:  ANNUAL EXAM 1. Women's annual routine gynecological examination   2. Encounter for screening mammogram for malignant neoplasm of breast   3. Screening for cholesterol level   4. Screening for diabetes mellitus   5. Encounter for vitamin deficiency screening   6. Screening for thyroid disorder      Screening Plan:            1.  Cervical Screening-  Pap smear schedule reviewed with patient, due next year  2. Breast screening- Exam annually and mammogram>40 planned, sch for 07/04/20  3. Colonoscopy every 10 years, Hemoccult testing - after age 54 (next year)  4. Labs To return fasting at a later date  5. Counseling for contraception: bilateral tubal ligation   6. Covid vaccine UTD. Plans Flu shot later date    F/U  Return in about 1 year (around 06/14/2021) for Annual.  Barnett Applebaum, MD, [facility], [facility] 06/14/2020  2:23 PM

## 2020-06-14 NOTE — Patient Instructions (Addendum)
PAP every three years Mammogram every year    at [facility] Colonoscopy every 10 years Labs yearly (soon fasting)  Thank you for choosing [facility]. As part of our ongoing efforts to improve patient experience, we would appreciate your feedback. Please fill out the short survey that you will receive by mail or MyChart. Your opinion is important to [place]! - Dr. Kenton Kingfisher

## 2020-06-17 ENCOUNTER — Other Ambulatory Visit: Payer: Self-pay | Admitting: Obstetrics & Gynecology

## 2020-06-17 ENCOUNTER — Other Ambulatory Visit: Payer: BC Managed Care – PPO

## 2020-06-17 ENCOUNTER — Other Ambulatory Visit: Payer: Self-pay

## 2020-06-17 DIAGNOSIS — Z13220 Encounter for screening for lipoid disorders: Secondary | ICD-10-CM | POA: Diagnosis not present

## 2020-06-17 DIAGNOSIS — Z1329 Encounter for screening for other suspected endocrine disorder: Secondary | ICD-10-CM | POA: Diagnosis not present

## 2020-06-17 DIAGNOSIS — Z131 Encounter for screening for diabetes mellitus: Secondary | ICD-10-CM

## 2020-06-17 DIAGNOSIS — Z1321 Encounter for screening for nutritional disorder: Secondary | ICD-10-CM

## 2020-06-18 LAB — LIPID PANEL
Chol/HDL Ratio: 2.7 ratio (ref 0.0–4.4)
Cholesterol, Total: 160 mg/dL (ref 100–199)
HDL: 60 mg/dL (ref >39)
LDL Chol Calc (NIH): 92 mg/dL (ref 0–99)
Triglycerides: 32 mg/dL (ref 0–149)
VLDL Cholesterol Cal: 8 mg/dL (ref 5–40)

## 2020-06-18 LAB — TSH: TSH: 0.838 u[IU]/mL (ref 0.450–4.500)

## 2020-06-18 LAB — GLUCOSE, FASTING: Glucose, Plasma: 87 mg/dL (ref 65–99)

## 2020-06-18 LAB — VITAMIN D 25 HYDROXY (VIT D DEFICIENCY, FRACTURES): Vit D, 25-Hydroxy: 30.2 ng/mL (ref 30.0–100.0)

## 2020-07-04 ENCOUNTER — Ambulatory Visit
Admission: RE | Admit: 2020-07-04 | Discharge: 2020-07-04 | Disposition: A | Discharge status: Home / Self Care | Payer: BC Managed Care – PPO | Source: Ambulatory Visit | Attending: Obstetrics & Gynecology | Admitting: Obstetrics & Gynecology

## 2020-07-04 ENCOUNTER — Other Ambulatory Visit: Payer: Self-pay

## 2020-07-04 DIAGNOSIS — Z1231 Encounter for screening mammogram for malignant neoplasm of breast: Secondary | ICD-10-CM | POA: Diagnosis not present

## 2020-07-27 DIAGNOSIS — M7552 Bursitis of left shoulder: Secondary | ICD-10-CM | POA: Diagnosis not present

## 2020-07-27 DIAGNOSIS — M79602 Pain in left arm: Secondary | ICD-10-CM | POA: Diagnosis not present

## 2020-12-09 DIAGNOSIS — D2372 Other benign neoplasm of skin of left lower limb, including hip: Secondary | ICD-10-CM | POA: Diagnosis not present

## 2020-12-09 DIAGNOSIS — D2371 Other benign neoplasm of skin of right lower limb, including hip: Secondary | ICD-10-CM | POA: Diagnosis not present

## 2020-12-09 DIAGNOSIS — Z85820 Personal history of malignant melanoma of skin: Secondary | ICD-10-CM | POA: Diagnosis not present

## 2020-12-09 DIAGNOSIS — D485 Neoplasm of uncertain behavior of skin: Secondary | ICD-10-CM | POA: Diagnosis not present

## 2020-12-16 DIAGNOSIS — M778 Other enthesopathies, not elsewhere classified: Secondary | ICD-10-CM | POA: Diagnosis not present

## 2020-12-16 DIAGNOSIS — M79602 Pain in left arm: Secondary | ICD-10-CM | POA: Diagnosis not present

## 2021-01-06 DIAGNOSIS — M778 Other enthesopathies, not elsewhere classified: Secondary | ICD-10-CM | POA: Diagnosis not present

## 2021-01-20 DIAGNOSIS — M778 Other enthesopathies, not elsewhere classified: Secondary | ICD-10-CM | POA: Diagnosis not present

## 2021-01-23 DIAGNOSIS — M778 Other enthesopathies, not elsewhere classified: Secondary | ICD-10-CM | POA: Diagnosis not present

## 2021-02-01 DIAGNOSIS — M778 Other enthesopathies, not elsewhere classified: Secondary | ICD-10-CM | POA: Diagnosis not present

## 2021-02-17 DIAGNOSIS — M778 Other enthesopathies, not elsewhere classified: Secondary | ICD-10-CM | POA: Diagnosis not present

## 2021-03-10 DIAGNOSIS — G8929 Other chronic pain: Secondary | ICD-10-CM | POA: Diagnosis not present

## 2021-03-10 DIAGNOSIS — M25512 Pain in left shoulder: Secondary | ICD-10-CM | POA: Diagnosis not present

## 2021-03-17 DIAGNOSIS — M25512 Pain in left shoulder: Secondary | ICD-10-CM | POA: Diagnosis not present

## 2021-03-17 DIAGNOSIS — G8929 Other chronic pain: Secondary | ICD-10-CM | POA: Diagnosis not present

## 2021-03-31 DIAGNOSIS — G8929 Other chronic pain: Secondary | ICD-10-CM | POA: Diagnosis not present

## 2021-03-31 DIAGNOSIS — M25512 Pain in left shoulder: Secondary | ICD-10-CM | POA: Diagnosis not present

## 2021-04-14 DIAGNOSIS — M25512 Pain in left shoulder: Secondary | ICD-10-CM | POA: Diagnosis not present

## 2021-04-14 DIAGNOSIS — G8929 Other chronic pain: Secondary | ICD-10-CM | POA: Diagnosis not present

## 2021-04-21 DIAGNOSIS — M25512 Pain in left shoulder: Secondary | ICD-10-CM | POA: Diagnosis not present

## 2021-04-21 DIAGNOSIS — G8929 Other chronic pain: Secondary | ICD-10-CM | POA: Diagnosis not present

## 2021-04-28 DIAGNOSIS — G8929 Other chronic pain: Secondary | ICD-10-CM | POA: Diagnosis not present

## 2021-04-28 DIAGNOSIS — M25512 Pain in left shoulder: Secondary | ICD-10-CM | POA: Diagnosis not present

## 2021-05-12 DIAGNOSIS — M25512 Pain in left shoulder: Secondary | ICD-10-CM | POA: Diagnosis not present

## 2021-05-12 DIAGNOSIS — G8929 Other chronic pain: Secondary | ICD-10-CM | POA: Diagnosis not present

## 2021-05-19 DIAGNOSIS — M25512 Pain in left shoulder: Secondary | ICD-10-CM | POA: Diagnosis not present

## 2021-05-19 DIAGNOSIS — G8929 Other chronic pain: Secondary | ICD-10-CM | POA: Diagnosis not present

## 2021-05-22 ENCOUNTER — Other Ambulatory Visit: Payer: Self-pay | Admitting: Obstetrics & Gynecology

## 2021-05-22 DIAGNOSIS — Z1231 Encounter for screening mammogram for malignant neoplasm of breast: Secondary | ICD-10-CM

## 2021-05-29 DIAGNOSIS — G8929 Other chronic pain: Secondary | ICD-10-CM | POA: Diagnosis not present

## 2021-05-29 DIAGNOSIS — M25512 Pain in left shoulder: Secondary | ICD-10-CM | POA: Diagnosis not present

## 2021-06-14 DIAGNOSIS — G8929 Other chronic pain: Secondary | ICD-10-CM | POA: Diagnosis not present

## 2021-06-14 DIAGNOSIS — M25512 Pain in left shoulder: Secondary | ICD-10-CM | POA: Diagnosis not present

## 2021-06-16 ENCOUNTER — Other Ambulatory Visit: Payer: Self-pay

## 2021-06-16 ENCOUNTER — Encounter: Payer: Self-pay | Admitting: Obstetrics & Gynecology

## 2021-06-16 ENCOUNTER — Ambulatory Visit (INDEPENDENT_AMBULATORY_CARE_PROVIDER_SITE_OTHER): Payer: BC Managed Care – PPO | Admitting: Obstetrics & Gynecology

## 2021-06-16 ENCOUNTER — Other Ambulatory Visit (HOSPITAL_COMMUNITY)
Admission: RE | Admit: 2021-06-16 | Discharge: 2021-06-16 | Disposition: A | Discharge status: Home / Self Care | Payer: BC Managed Care – PPO | Source: Ambulatory Visit | Attending: Obstetrics & Gynecology | Admitting: Obstetrics & Gynecology

## 2021-06-16 VITALS — BP 120/80 | Ht 66.0 in | Wt 156.0 lb

## 2021-06-16 DIAGNOSIS — Z124 Encounter for screening for malignant neoplasm of cervix: Secondary | ICD-10-CM | POA: Insufficient documentation

## 2021-06-16 DIAGNOSIS — Z1211 Encounter for screening for malignant neoplasm of colon: Secondary | ICD-10-CM | POA: Diagnosis not present

## 2021-06-16 DIAGNOSIS — Z01419 Encounter for gynecological examination (general) (routine) without abnormal findings: Secondary | ICD-10-CM | POA: Diagnosis not present

## 2021-06-16 DIAGNOSIS — Z23 Encounter for immunization: Secondary | ICD-10-CM | POA: Diagnosis not present

## 2021-06-16 NOTE — Progress Notes (Signed)
HPI:      [name] is a 50 y.o. [id number] who LMP was Patient's last menstrual period was 06/05/2021., she presents today for her annual examination. The patient has no complaints today. The patient is sexually active. Her last pap: approximate date 2019 and was normal and last mammogram: approximate date 2021 and was normal. The patient does perform self breast exams.  There is notable family history of breast or ovarian cancer in her family.  The patient has regular exercise: yes.  The patient denies current symptoms of depression.    GYN History: Contraception: tubal ligation  PMHx: Past Medical History:  Diagnosis Date   Fibroids    Hypertension    Past Surgical History:  Procedure Laterality Date   CESAREAN SECTION     HERNIA REPAIR     TUBAL LIGATION     Family History  Problem Relation Age of Onset   Breast cancer Mother 87   Uterine cancer Paternal Aunt 5   Social History   Tobacco Use   Smoking status: Never   Smokeless tobacco: Never  Substance Use Topics   Alcohol use: Never   Drug use: Never    Current Outpatient Medications:    meloxicam (MOBIC) 7.5 MG tablet, TAKE 1 TABLET(7.5 MG) BY MOUTH EVERY 12 HOURS AS NEEDED FOR PAIN, Disp: 20 tablet, Rfl: 6 Allergies: Codeine  Review of Systems  Constitutional:  Negative for chills, fever and malaise/fatigue.  HENT:  Negative for congestion, sinus pain and sore throat.   Eyes:  Negative for blurred vision and pain.  Respiratory:  Negative for cough and wheezing.   Cardiovascular:  Negative for chest pain and leg swelling.  Gastrointestinal:  Negative for abdominal pain, constipation, diarrhea, heartburn, nausea and vomiting.  Genitourinary:  Negative for dysuria, frequency, hematuria and urgency.  Musculoskeletal:  Negative for back pain, joint pain, myalgias and neck pain.  Skin:  Negative for itching and rash.  Neurological:  Negative for dizziness, tremors and weakness.  Endo/Heme/Allergies:   Does not bruise/bleed easily.  Psychiatric/Behavioral:  Negative for depression. The patient is not nervous/anxious and does not have insomnia.    Objective: BP 120/80   Ht 5\' 6"  (1.676 m)   Wt 156 lb (70.8 kg)   LMP 06/05/2021   BMI 25.18 kg/m   Filed Weights   06/16/21 0800  Weight: 156 lb (70.8 kg)   Body mass index is 25.18 kg/m. Physical Exam Constitutional:      General: She is not in acute distress.    Appearance: She is well-developed.  Genitourinary:     Bladder, rectum and urethral meatus normal.     No lesions in the vagina.     Right Labia: No rash, tenderness or lesions.    Left Labia: No tenderness, lesions or rash.    No vaginal bleeding.      Right Adnexa: not tender and no mass present.    Left Adnexa: not tender and no mass present.    No cervical motion tenderness, friability, lesion or polyp.     Uterus is not enlarged.     No uterine mass detected.    Pelvic exam was performed with patient in the lithotomy position.  Breasts:    Right: No mass, skin change or tenderness.     Left: No mass, skin change or tenderness.  HENT:     Head: Normocephalic and atraumatic. No laceration.     Right Ear: Hearing normal.     Left  Ear: Hearing normal.     Mouth/Throat:     Pharynx: Uvula midline.  Eyes:     Pupils: Pupils are equal, round, and reactive to light.  Neck:     Thyroid: No thyromegaly.  Cardiovascular:     Rate and Rhythm: Normal rate and regular rhythm.     Heart sounds: No murmur heard.   No friction rub. No gallop.  Pulmonary:     Effort: Pulmonary effort is normal. No respiratory distress.     Breath sounds: Normal breath sounds. No wheezing.  Abdominal:     General: Bowel sounds are normal. There is no distension.     Palpations: Abdomen is soft.     Tenderness: There is no abdominal tenderness. There is no rebound.  Musculoskeletal:        General: Normal range of motion.     Cervical back: Normal range of motion and neck supple.   Neurological:     Mental Status: She is alert and oriented to person, place, and time.     Cranial Nerves: No cranial nerve deficit.  Skin:    General: Skin is warm and dry.  Psychiatric:        Judgment: Judgment normal.  Vitals reviewed.    Assessment:  ANNUAL EXAM 1. Women's annual routine gynecological examination   2. Screening for cervical cancer   3. Screen for colon cancer      Screening Plan:            1.  Cervical Screening-  Pap smear done today  2. Breast screening- Exam annually and mammogram>40 planned   3. Colonoscopy every 10 years, Hemoccult testing - after age 15, to be scheduled soon  4. Labs  UTD  5. Counseling for contraception: bilateral tubal ligation  The pregnancy intention screening data noted above was reviewed. Potential methods of contraception were discussed. The patient elected to proceed with Female Sterilization.   6. TDaP today     F/U  Return in about 1 year (around 06/16/2022) for Annual.  Barnett Applebaum, MD, [facility], [facility] 06/16/2021  9:06 AM

## 2021-06-16 NOTE — Addendum Note (Signed)
Addended by: Quintella Baton D on: 06/16/2021 09:20 AM   Modules accepted: Orders

## 2021-06-16 NOTE — Patient Instructions (Signed)
PAP every three years Mammogram every year    Call [phone] to schedule at [place] Colonoscopy every 10 years Labs yearly (with PCP)  Thank you for choosing [facility]. As part of our ongoing efforts to improve patient experience, we would appreciate your feedback. Please fill out the short survey that you will receive by mail or MyChart. Your opinion is important to us! - Dr. Leora Platt  Recommendations to boost your immunity to prevent illness such as viral flu and colds, including covid19, are as follows:       - - -  Vitamin K2 and Vitamin D3  - - - Take Vitamin K2 at 200-300 mcg daily (usually 2-3 pills daily of the over the counter formulation). Take Vitamin D3 at 3000-4000 U daily (usually 3-4 pills daily of the over the counter formulation). Studies show that these two at high normal levels in your system are very effective in keeping your immunity so strong and protective that you will be unlikely to contract viral illness such as those listed above.  Dr Antonae Zbikowski  

## 2021-06-21 LAB — CYTOLOGY - PAP
Comment: NEGATIVE
Diagnosis: NEGATIVE
Diagnosis: REACTIVE
High risk HPV: NEGATIVE

## 2021-06-26 DIAGNOSIS — G8929 Other chronic pain: Secondary | ICD-10-CM | POA: Diagnosis not present

## 2021-06-26 DIAGNOSIS — M25512 Pain in left shoulder: Secondary | ICD-10-CM | POA: Diagnosis not present

## 2021-06-28 ENCOUNTER — Telehealth: Payer: Self-pay

## 2021-06-28 NOTE — Telephone Encounter (Signed)
Called patient no answer left voicemail for a call back letter sent as well

## 2021-07-05 ENCOUNTER — Other Ambulatory Visit: Payer: Self-pay | Admitting: Obstetrics & Gynecology

## 2021-07-05 ENCOUNTER — Encounter: Payer: Self-pay | Admitting: Obstetrics & Gynecology

## 2021-07-05 MED ORDER — MELOXICAM 7.5 MG PO TABS
ORAL_TABLET | ORAL | 6 refills | Status: DC
Start: 2021-07-05 — End: 2022-07-25

## 2021-07-07 ENCOUNTER — Ambulatory Visit
Admission: RE | Admit: 2021-07-07 | Discharge: 2021-07-07 | Disposition: A | Discharge status: Home / Self Care | Payer: BC Managed Care – PPO | Source: Ambulatory Visit | Attending: Obstetrics & Gynecology | Admitting: Obstetrics & Gynecology

## 2021-07-07 DIAGNOSIS — Z1231 Encounter for screening mammogram for malignant neoplasm of breast: Secondary | ICD-10-CM | POA: Diagnosis not present

## 2021-07-21 DIAGNOSIS — G8929 Other chronic pain: Secondary | ICD-10-CM | POA: Diagnosis not present

## 2021-07-21 DIAGNOSIS — M25512 Pain in left shoulder: Secondary | ICD-10-CM | POA: Diagnosis not present

## 2021-08-22 IMAGING — MG DIGITAL SCREENING BILAT W/ TOMO W/ CAD
8 series · 8 of 24 positions shown · non-contrast
Comparison: Previous exam(s).

CLINICAL DATA: Screening.

EXAM:
DIGITAL SCREENING BILATERAL MAMMOGRAM WITH TOMO AND CAD

[R MLO synth-2D]
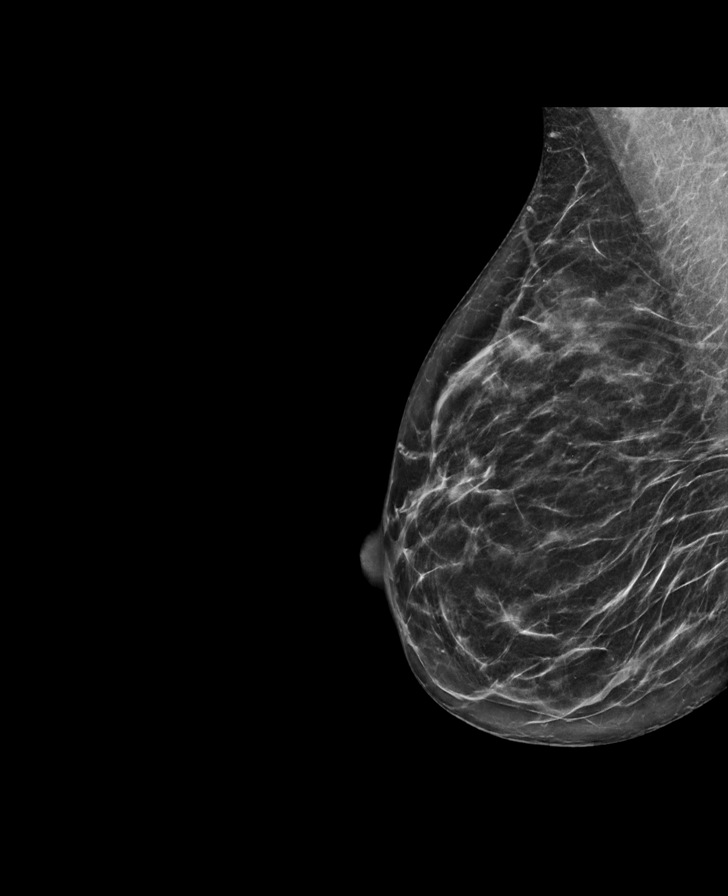

[L MLO synth-2D]
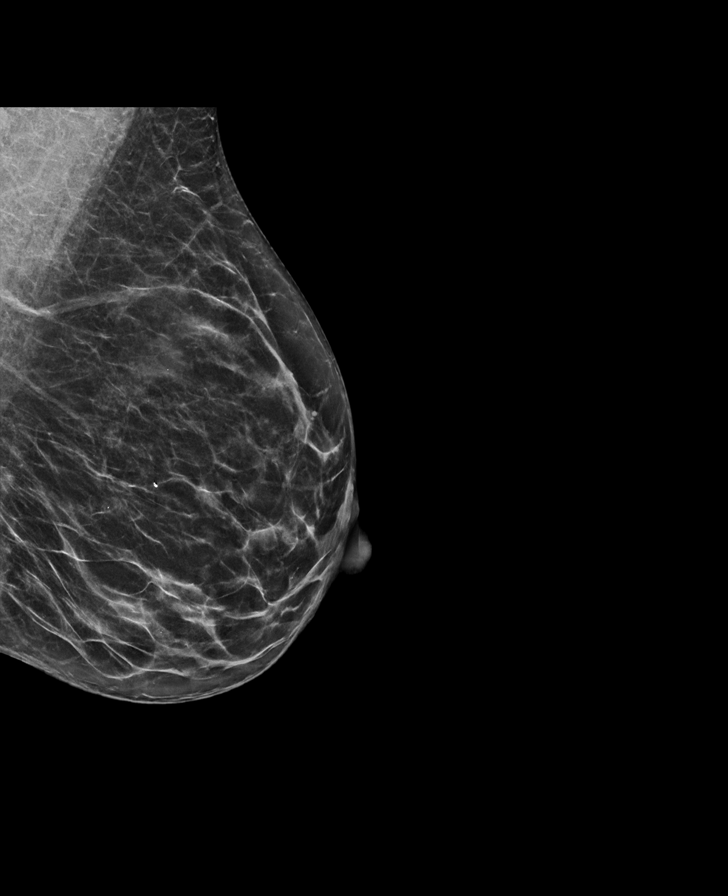

[R CC synth-2D]
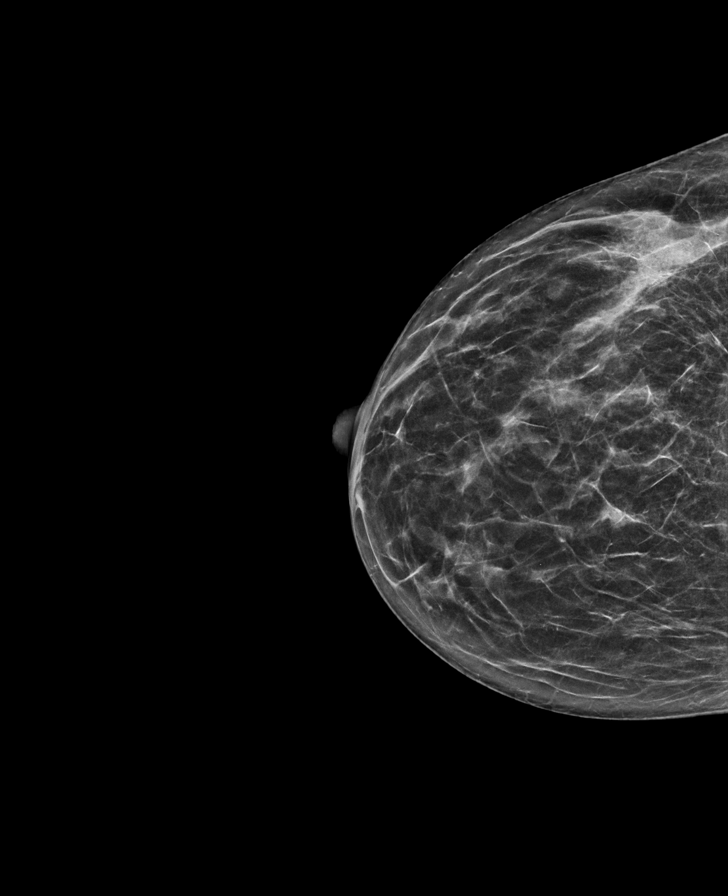

[L CC synth-2D]
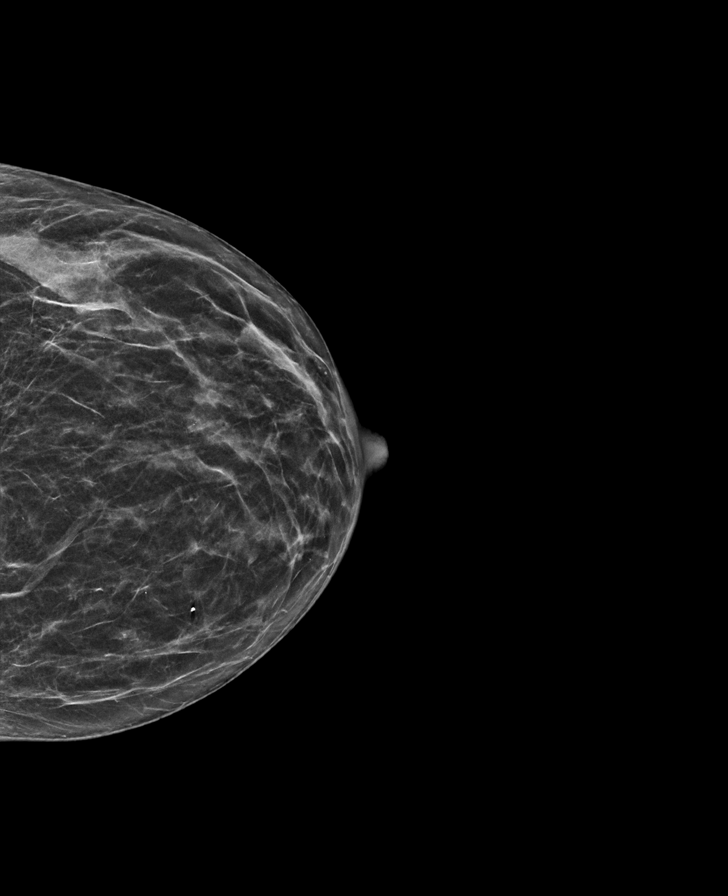

[R MLO tomo · tomo slice 32/63.0]
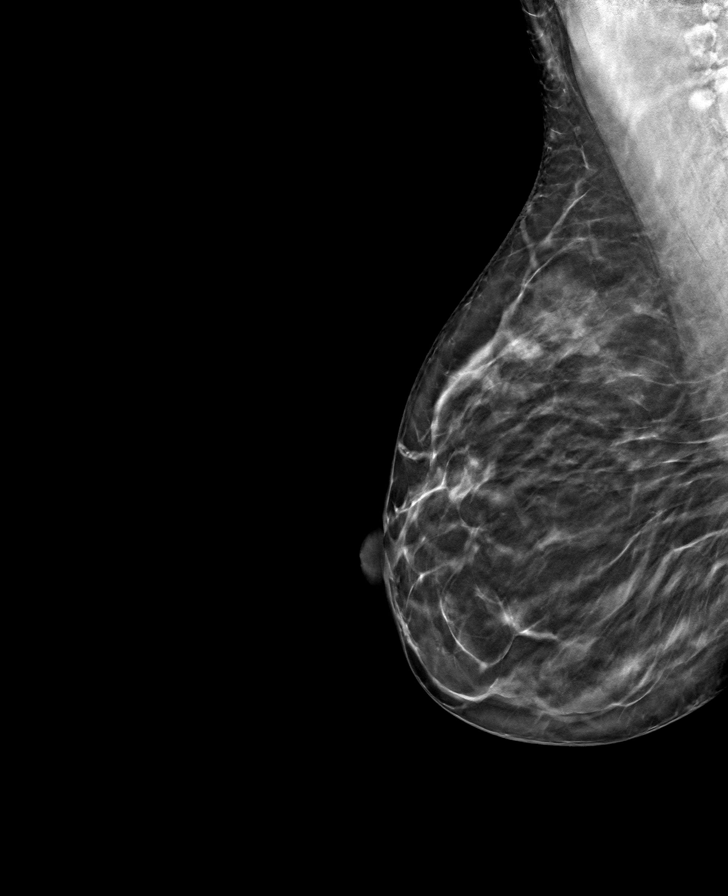

[L CC tomo · tomo slice 27/54.0]
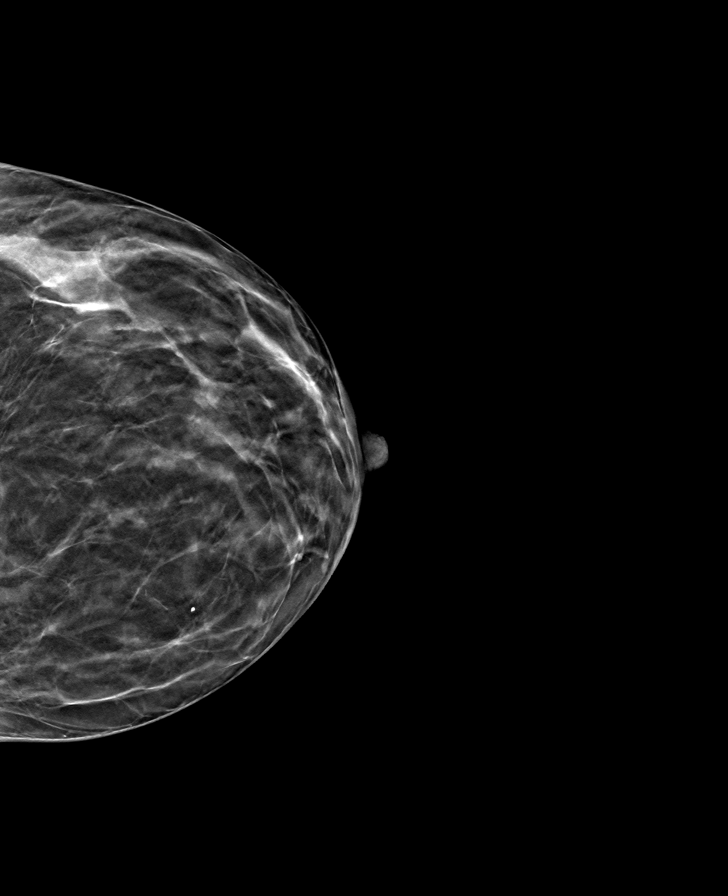

[R CC tomo · tomo slice 29/58.0]
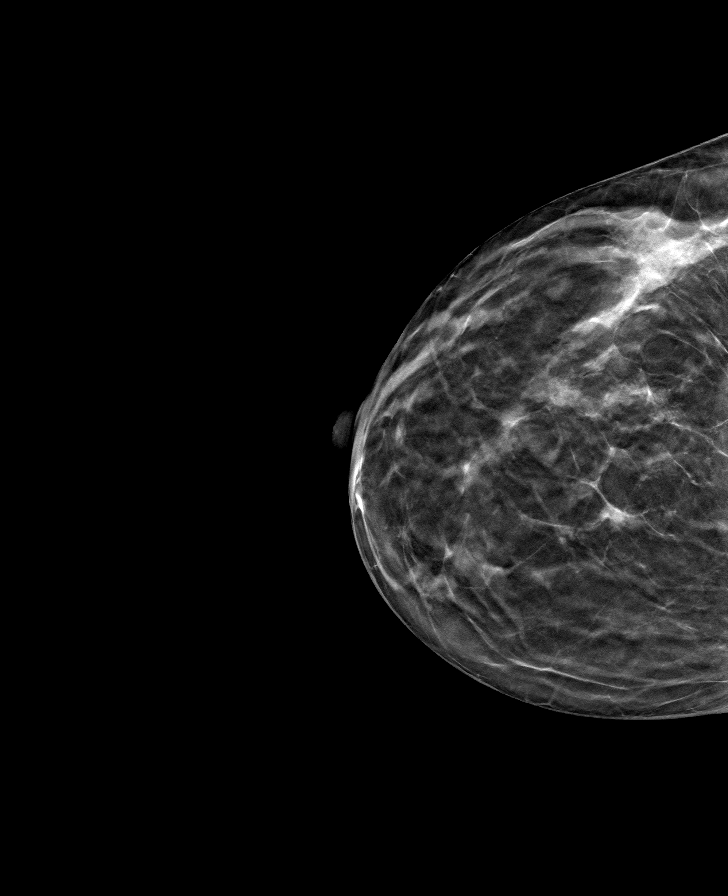

[L MLO tomo · tomo slice 31/61.0]
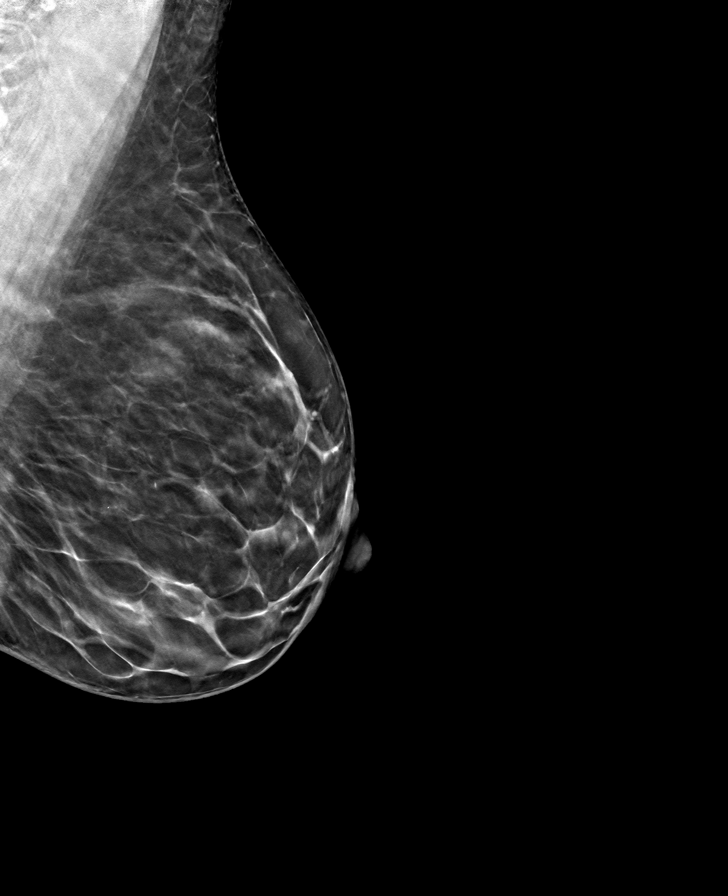

[8 of 24 positions shown; findings below may reference images not displayed]

ACR Breast Density Category c: The breast tissue is heterogeneously
dense, which may obscure small masses.
FINDINGS: There are no findings suspicious for malignancy. Images were
processed with CAD.
IMPRESSION: No mammographic evidence of malignancy. A result letter of this
screening mammogram will be mailed directly to the patient.

RECOMMENDATION:
Screening mammogram in one year. (Code:FT-U-LHB)

BI-RADS CATEGORY  1: Negative.

## 2021-12-18 ENCOUNTER — Encounter: Payer: Self-pay | Admitting: Medical

## 2021-12-18 ENCOUNTER — Ambulatory Visit: Payer: BC Managed Care – PPO | Admitting: Medical

## 2021-12-18 VITALS — BP 120/88 | HR 72 | Temp 97.9°F | Resp 16

## 2021-12-18 DIAGNOSIS — B9689 Other specified bacterial agents as the cause of diseases classified elsewhere: Secondary | ICD-10-CM

## 2021-12-18 DIAGNOSIS — J0110 Acute frontal sinusitis, unspecified: Secondary | ICD-10-CM

## 2021-12-18 MED ORDER — AMOXICILLIN-POT CLAVULANATE 875-125 MG PO TABS: 1.0000 | ORAL_TABLET | Freq: Two times a day (BID) | ORAL | 0 refills | Status: DC

## 2021-12-18 NOTE — Progress Notes (Signed)
? ?  Subjective:  ? ? Patient ID: Cassidy Wright, female    DOB: 09-29-1970, 51 y.o.   MRN: [id number] ? ?HPI ?51 yo congestion, cough , no fever or bodyaches. Tried OTC Mucinex , sudafed. ?Cough is productive yellow, history of sinus, bronchitis but no pneumonia. ? ?Blood pressure 120/88, pulse 72, temperature 97.9 ?F (36.6 ?C), temperature source Tympanic, resp. rate 16, last menstrual period 12/18/2021, SpO2 98 %. ? ?Allergies  ?Allergen Reactions  ? Codeine   ? ? ? ?Review of Systems  ?HENT:  Positive for congestion, ear pain (yesterday bilateral), postnasal drip, rhinorrhea, sinus pressure, sinus pain and sore throat (little bit this am, feeling better now.).   ?Respiratory:  Positive for cough. Negative for shortness of breath.   ?Cardiovascular:  Negative for chest pain.  ?Neurological:  Positive for headaches.  ? ?   ?Objective:  ? Physical Exam ?Vitals and nursing note reviewed.  ?Constitutional:   ?   Appearance: Normal appearance. She is normal weight.  ?HENT:  ?   Head: Normocephalic and atraumatic.  ?   Right Ear: Ear canal and external ear normal. A middle ear effusion is present.  ?   Left Ear: Ear canal and external ear normal. A middle ear effusion is present.  ?   Nose: Congestion present.  ?   Mouth/Throat:  ?   Mouth: Mucous membranes are moist.  ?   Pharynx: Oropharynx is clear.  ?Eyes:  ?   Extraocular Movements: Extraocular movements intact.  ?   Pupils: Pupils are equal, round, and reactive to light.  ?Cardiovascular:  ?   Heart sounds: Normal heart sounds.  ?Pulmonary:  ?   Effort: Pulmonary effort is normal.  ?   Breath sounds: Normal breath sounds.  ?Musculoskeletal:     ?   General: Normal range of motion.  ?   Cervical back: Normal range of motion and neck supple.  ?Skin: ?   General: Skin is warm and dry.  ?   Capillary Refill: Capillary refill takes less than 2 seconds.  ?Neurological:  ?   General: No focal deficit present.  ?   Mental Status: She is alert and oriented to person,  place, and time.  ?Psychiatric:     ?   Mood and Affect: Mood normal.     ?   Behavior: Behavior normal.     ?   Thought Content: Thought content normal.     ?   Judgment: Judgment normal.  ? ? ? ?Cough noted in room. ? ?   ?Assessment & Plan:  ? ?Encounter Diagnoses  ?Name Primary?  ? Acute non-recurrent frontal sinusitis Yes  ? Bacterial respiratory infection   ? ?Meds ordered this encounter  ?Medications  ? amoxicillin-clavulanate (AUGMENTIN) 875-125 MG tablet  ?  Sig: Take 1 tablet by mouth 2 (two) times daily.  ?  Dispense:  20 tablet  ?  Refill:  0  ? Continue Zyrtec daily. ?Follow up here or your MD if not improving in  3 -5 days. ?Patient verbalizes understanding and has no questions at discharge. ?

## 2022-03-19 ENCOUNTER — Telehealth: Payer: Self-pay

## 2022-03-19 ENCOUNTER — Other Ambulatory Visit: Payer: Self-pay

## 2022-03-19 DIAGNOSIS — Z1211 Encounter for screening for malignant neoplasm of colon: Secondary | ICD-10-CM

## 2022-03-19 MED ORDER — NA SULFATE-K SULFATE-MG SULF 17.5-3.13-1.6 GM/177ML PO SOLN: 1.0000 | Freq: Once | ORAL | 0 refills | Status: AC

## 2022-03-19 NOTE — Telephone Encounter (Signed)
Patient calling to schedule colonoscopy. Please return patients call.

## 2022-03-19 NOTE — Telephone Encounter (Signed)
Gastroenterology Pre-Procedure Review  Request Date: 04/27/22 Requesting Physician: Dr. Allen Norris  PATIENT REVIEW QUESTIONS: The patient responded to the following health history questions as indicated:    1. Are you having any GI issues? no 2. Do you have a personal history of Polyps? no 3. Do you have a family history of Colon Cancer or Polyps? no 4. Diabetes Mellitus? no 5. Joint replacements in the past 12 months?no 6. Major health problems in the past 3 months?no 7. Any artificial heart valves, MVP, or defibrillator?no    MEDICATIONS & ALLERGIES:    Patient reports the following regarding taking any anticoagulation/antiplatelet therapy:   Plavix, Coumadin, Eliquis, Xarelto, Lovenox, Pradaxa, Brilinta, or Effient? no Aspirin? no  Patient confirms/reports the following medications:  Current Outpatient Medications  Medication Sig Dispense Refill   amoxicillin-clavulanate (AUGMENTIN) 875-125 MG tablet Take 1 tablet by mouth 2 (two) times daily. 20 tablet 0   cetirizine (ZYRTEC) 10 MG chewable tablet Chew 10 mg by mouth daily.     meloxicam (MOBIC) 7.5 MG tablet TAKE 1 TABLET(7.5 MG) BY MOUTH EVERY 12 HOURS AS NEEDED FOR PAIN 20 tablet 6   No current facility-administered medications for this visit.    Patient confirms/reports the following allergies:  Allergies  Allergen Reactions   Codeine     No orders of the defined types were placed in this encounter.   AUTHORIZATION INFORMATION Primary Insurance: 1D#: Group #:  Secondary Insurance: 1D#: Group #:  SCHEDULE INFORMATION: Date: 04/27/22 Time: Location: [facility]

## 2022-04-18 ENCOUNTER — Encounter: Payer: Self-pay | Admitting: Gastroenterology

## 2022-04-19 ENCOUNTER — Encounter: Payer: Self-pay | Admitting: Gastroenterology

## 2022-04-24 ENCOUNTER — Telehealth: Payer: Self-pay | Admitting: Gastroenterology

## 2022-04-24 NOTE — Telephone Encounter (Signed)
Patient called stating that she got an estimate in mychart for $2,000 for her colonoscopy. Patient states it should be listed as preventative because this is her first colonoscopy. Requesting call back.

## 2022-04-26 NOTE — Anesthesia Preprocedure Evaluation (Addendum)
Anesthesia Evaluation  Patient identified by MRN, date of birth, ID band Patient awake    Reviewed: Allergy & Precautions, NPO status , Patient's Chart, lab work & pertinent test results  Airway Mallampati: II  TM Distance: >3 FB Neck ROM: full    Dental no notable dental hx.    Pulmonary neg pulmonary ROS,    Pulmonary exam normal        Cardiovascular Exercise Tolerance: Good Normal cardiovascular exam     Neuro/Psych negative neurological ROS  negative psych ROS   GI/Hepatic negative GI ROS, Neg liver ROS,   Endo/Other  negative endocrine ROS  Renal/GU negative Renal ROS  negative genitourinary   Musculoskeletal   Abdominal Normal abdominal exam  (+)   Peds  Hematology negative hematology ROS (+)   Anesthesia Other Findings Past Medical History: No date: Fibroids No date: Hypertension  Past Surgical History: No date: CESAREAN SECTION No date: HERNIA REPAIR No date: TUBAL LIGATION  BMI    Body Mass Index: 26.63 kg/m      Reproductive/Obstetrics negative OB ROS                            Anesthesia Physical Anesthesia Plan  ASA: 2  Anesthesia Plan: General   Post-op Pain Management: Minimal or no pain anticipated   Induction: Intravenous  PONV Risk Score and Plan: Propofol infusion, TIVA and Treatment may vary due to age or medical condition  Airway Management Planned: Natural Airway  Additional Equipment:   Intra-op Plan:   Post-operative Plan:   Informed Consent: I have reviewed the patients History and Physical, chart, labs and discussed the procedure including the risks, benefits and alternatives for the proposed anesthesia with the patient or authorized representative who has indicated his/her understanding and acceptance.     Dental Advisory Given  Plan Discussed with: Anesthesiologist, CRNA and Surgeon  Anesthesia Plan Comments:        Anesthesia  Quick Evaluation

## 2022-04-27 ENCOUNTER — Encounter: Payer: Self-pay | Admitting: Gastroenterology

## 2022-04-27 ENCOUNTER — Other Ambulatory Visit: Payer: Self-pay

## 2022-04-27 ENCOUNTER — Ambulatory Visit: Payer: BC Managed Care – PPO | Admitting: Anesthesiology

## 2022-04-27 ENCOUNTER — Ambulatory Visit
Admission: RE | Admit: 2022-04-27 | Discharge: 2022-04-27 | Disposition: A | Discharge status: Home / Self Care | Payer: BC Managed Care – PPO | Attending: Gastroenterology | Admitting: Gastroenterology

## 2022-04-27 ENCOUNTER — Encounter
Admission: RE | Disposition: A | Discharge status: Home / Self Care | Payer: Self-pay | Source: Home / Self Care | Attending: Gastroenterology

## 2022-04-27 DIAGNOSIS — I10 Essential (primary) hypertension: Secondary | ICD-10-CM | POA: Insufficient documentation

## 2022-04-27 DIAGNOSIS — K635 Polyp of colon: Secondary | ICD-10-CM | POA: Insufficient documentation

## 2022-04-27 DIAGNOSIS — Z1211 Encounter for screening for malignant neoplasm of colon: Secondary | ICD-10-CM | POA: Diagnosis not present

## 2022-04-27 HISTORY — PX: COLONOSCOPY WITH PROPOFOL: SHX5780

## 2022-04-27 HISTORY — PX: POLYPECTOMY: SHX5525

## 2022-04-27 LAB — POCT PREGNANCY, URINE: Preg Test, Ur: NEGATIVE

## 2022-04-27 SURGERY — COLONOSCOPY WITH PROPOFOL
Anesthesia: Monitor Anesthesia Care | Site: Rectum

## 2022-04-27 MED ORDER — PROPOFOL 10 MG/ML IV BOLUS
INTRAVENOUS | Status: DC | PRN
  Administered 2022-04-27 (×2): 50 mg via INTRAVENOUS
  Administered 2022-04-27: 100 mg via INTRAVENOUS
  Administered 2022-04-27: 50 mg via INTRAVENOUS
  Administered 2022-04-27: 25 mg via INTRAVENOUS

## 2022-04-27 MED ORDER — SODIUM CHLORIDE 0.9 % IV SOLN: INTRAVENOUS | Status: DC

## 2022-04-27 MED ORDER — STERILE WATER FOR IRRIGATION IR SOLN
Status: DC | PRN
  Administered 2022-04-27: 150 mL

## 2022-04-27 MED ORDER — LACTATED RINGERS IV SOLN: INTRAVENOUS | Status: DC

## 2022-04-27 SURGICAL SUPPLY — 21 items

## 2022-04-27 NOTE — Op Note (Signed)
[facility] Gastroenterology Patient Name: Cassidy Wright Procedure Date: 04/27/2022 10:47 AM MRN: [id number] Account #: [id number] Date of Birth: [birth year] Admit Type: Outpatient Age: 51 Room: [facility] OR ROOM 01 Gender: Female Note Status: Finalized Instrument Name: [id number] Procedure:             Colonoscopy Indications:           Screening for colorectal malignant neoplasm Providers:             Lucilla Lame MD, MD Referring MD:          Leona Carry. Hall Busing, MD (Referring MD) Medicines:             Propofol per Anesthesia Complications:         No immediate complications. Procedure:             Pre-Anesthesia Assessment:                        - Prior to the procedure, a History and Physical was                         performed, and patient medications and allergies were                         reviewed. The patient's tolerance of previous                         anesthesia was also reviewed. The risks and benefits                         of the procedure and the sedation options and risks                         were discussed with the patient. All questions were                         answered, and informed consent was obtained. Prior                         Anticoagulants: The patient has taken no previous                         anticoagulant or antiplatelet agents. ASA Grade                         Assessment: II - A patient with mild systemic disease.                         After reviewing the risks and benefits, the patient                         was deemed in satisfactory condition to undergo the                         procedure.                        After obtaining informed consent, the colonoscope was  passed under direct vision. Throughout the procedure,                         the patient's blood pressure, pulse, and oxygen                         saturations were monitored continuously. The                         Colonoscope was  introduced through the anus and                         advanced to the the cecum, identified by appendiceal                         orifice and ileocecal valve. The colonoscopy was                         performed without difficulty. The patient tolerated                         the procedure well. The quality of the bowel                         preparation was excellent. Findings:      The perianal and digital rectal examinations were normal.      Two sessile polyps were found in the descending colon. The polyps were 2       to 3 mm in size. These polyps were removed with a cold biopsy forceps.       Resection and retrieval were complete. Impression:            - Two 2 to 3 mm polyps in the descending colon,                         removed with a cold biopsy forceps. Resected and                         retrieved. Recommendation:        - Discharge patient to home.                        - Resume previous diet.                        - Continue present medications.                        - Await pathology results.                        - If the pathology report reveals adenomatous tissue,                         then repeat the colonoscopy for surveillance in 7                         years otherwise 10 years. Procedure Code(s):     --- Professional ---  45380, Colonoscopy, flexible; with biopsy, single or                         multiple Diagnosis Code(s):     --- Professional ---                        Z12.11, Encounter for screening for malignant neoplasm                         of colon                        K63.5, Polyp of colon CPT copyright 2019 American Medical Association. All rights reserved. The codes documented in this report are preliminary and upon coder review may  be revised to meet current compliance requirements. Lucilla Lame MD, MD 04/27/2022 11:12:15 AM This report has been signed electronically. Number of Addenda: 0 Note Initiated On:  04/27/2022 10:47 AM Scope Withdrawal Time: 0 hours 8 minutes 24 seconds  Total Procedure Duration: 0 hours 11 minutes 51 seconds  Estimated Blood Loss:  Estimated blood loss: none.      [facility]

## 2022-04-27 NOTE — H&P (Signed)
   Lucilla Lame, MD [facility] [address] [place], [place] [address] Phone: [phone] Fax : [phone]  Primary Care Physician:  Albina Billet, MD Primary Gastroenterologist:  Dr. Allen Norris  Pre-Procedure History & Physical: HPI:  Cassidy Wright is a 51 y.o. female is here for a screening colonoscopy.   Past Medical History:  Diagnosis Date   Fibroids    Hypertension     Past Surgical History:  Procedure Laterality Date   CESAREAN SECTION     HERNIA REPAIR     TUBAL LIGATION      Prior to Admission medications   Medication Sig Start Date End Date Taking? Authorizing Provider  cetirizine (ZYRTEC) 10 MG chewable tablet Chew 10 mg by mouth daily.   Yes [provider]  meloxicam (MOBIC) 7.5 MG tablet TAKE 1 TABLET(7.5 MG) BY MOUTH EVERY 12 HOURS AS NEEDED FOR PAIN 07/05/21  Yes Gae Dry, MD  amoxicillin-clavulanate (AUGMENTIN) 875-125 MG tablet Take 1 tablet by mouth 2 (two) times daily. Patient not taking: Reported on 04/18/2022 12/18/21   Daryll Drown R, PA-C    Allergies as of 03/19/2022 - Review Complete 03/19/2022  Allergen Reaction Noted   Codeine  04/01/2017    Family History  Problem Relation Age of Onset   Breast cancer Mother 73   Uterine cancer Paternal Aunt 39    Social History   Socioeconomic History   Marital status: Married    Spouse name: Not on file   Number of children: Not on file   Years of education: Not on file   Highest education level: Not on file  Occupational History   Not on file  Tobacco Use   Smoking status: Never    Passive exposure: Never   Smokeless tobacco: Never  Vaping Use   Vaping Use: Not on file  Substance and Sexual Activity   Alcohol use: Never   Drug use: Never   Sexual activity: Yes  Other Topics Concern   Not on file  Social History Narrative   Not on file   Social Determinants of Health   Financial Resource Strain: Not on file  Food Insecurity: Not on file  Transportation  Needs: Not on file  Physical Activity: Not on file  Stress: Not on file  Social Connections: Not on file  Intimate Partner Violence: Not on file    Review of Systems: See HPI, otherwise negative ROS  Physical Exam: BP (!) 145/88   Pulse 67   Temp 98.6 F (37 C) (Temporal)   Resp 20   Ht '5\' 6"'$  (1.676 m)   Wt 71.8 kg   SpO2 97%   BMI 25.55 kg/m  General:   Alert,  pleasant and cooperative in NAD Head:  Normocephalic and atraumatic. Neck:  Supple; no masses or thyromegaly. Lungs:  Clear throughout to auscultation.    Heart:  Regular rate and rhythm. Abdomen:  Soft, nontender and nondistended. Normal bowel sounds, without guarding, and without rebound.   Neurologic:  Alert and  oriented x4;  grossly normal neurologically.  Impression/Plan: Cassidy Wright is now here to undergo a screening colonoscopy.  Risks, benefits, and alternatives regarding colonoscopy have been reviewed with the patient.  Questions have been answered.  All parties agreeable.

## 2022-04-27 NOTE — Anesthesia Postprocedure Evaluation (Signed)
Anesthesia Post Note  Patient: Cassidy Wright  Procedure(s) Performed: COLONOSCOPY WITH PROPOFOL (Rectum) POLYPECTOMY (Rectum)     Patient location during evaluation: PACU Anesthesia Type: General Level of consciousness: awake and alert Pain management: pain level controlled Vital Signs Assessment: post-procedure vital signs reviewed and stable Respiratory status: spontaneous breathing, nonlabored ventilation and respiratory function stable Cardiovascular status: blood pressure returned to baseline and stable Postop Assessment: no apparent nausea or vomiting Anesthetic complications: no   No notable events documented.  [place] Ouch

## 2022-04-27 NOTE — Transfer of Care (Signed)
Immediate Anesthesia Transfer of Care Note  Patient: Cassidy Wright  Procedure(s) Performed: COLONOSCOPY WITH PROPOFOL (Rectum) POLYPECTOMY (Rectum)  Patient Location: PACU  Anesthesia Type: MAC  Level of Consciousness: awake, alert  and patient cooperative  Airway and Oxygen Therapy: Patient Spontanous Breathing and Patient connected to supplemental oxygen  Post-op Assessment: Post-op Vital signs reviewed, Patient's Cardiovascular Status Stable, Respiratory Function Stable, Patent Airway and No signs of Nausea or vomiting  Post-op Vital Signs: Reviewed and stable  Complications: No notable events documented.

## 2022-04-30 LAB — SURGICAL PATHOLOGY

## 2022-05-03 ENCOUNTER — Encounter: Payer: Self-pay | Admitting: Gastroenterology

## 2022-07-13 ENCOUNTER — Other Ambulatory Visit: Payer: Self-pay | Admitting: Obstetrics and Gynecology

## 2022-07-13 DIAGNOSIS — Z1231 Encounter for screening mammogram for malignant neoplasm of breast: Secondary | ICD-10-CM

## 2022-07-18 ENCOUNTER — Ambulatory Visit
Admission: RE | Admit: 2022-07-18 | Discharge: 2022-07-18 | Disposition: A | Discharge status: Home / Self Care | Payer: BC Managed Care – PPO | Source: Ambulatory Visit | Attending: Obstetrics and Gynecology | Admitting: Obstetrics and Gynecology

## 2022-07-18 DIAGNOSIS — Z1231 Encounter for screening mammogram for malignant neoplasm of breast: Secondary | ICD-10-CM

## 2022-07-25 ENCOUNTER — Other Ambulatory Visit: Payer: Self-pay

## 2022-07-25 ENCOUNTER — Encounter: Payer: Self-pay | Admitting: Nurse Practitioner

## 2022-07-25 ENCOUNTER — Ambulatory Visit (INDEPENDENT_AMBULATORY_CARE_PROVIDER_SITE_OTHER): Payer: Self-pay | Admitting: Nurse Practitioner

## 2022-07-25 VITALS — BP 121/83 | HR 94 | Temp 98.2°F | Wt 160.0 lb

## 2022-07-25 DIAGNOSIS — R5081 Fever presenting with conditions classified elsewhere: Secondary | ICD-10-CM

## 2022-07-25 DIAGNOSIS — J069 Acute upper respiratory infection, unspecified: Secondary | ICD-10-CM

## 2022-07-25 LAB — POC SOFIA 2 FLU + SARS ANTIGEN FIA
Influenza A, POC: NEGATIVE
Influenza B, POC: NEGATIVE
SARS Coronavirus 2 Ag: NEGATIVE

## 2022-07-25 LAB — POCT RAPID STREP A (OFFICE): Rapid Strep A Screen: NEGATIVE

## 2022-07-25 NOTE — Progress Notes (Signed)
[facility] [address], [place] [address] [phone]  Office Visit Note  Patient Name: Cassidy Wright Date of Birth [id number]  Medical Record number [id number]  Date of Service: 07/25/2022  Chief Complaint  Patient presents with   Fever    States that last night she ran a 100.3 fever and then a low grade temp this morning of around 99, sone will be off ABX for strep on Saturday      HPI  Fever and headache since yesterday. Denies cough, she has had mild nasal congestion no sore throat   Son recovering form strep   She has not had a flu vaccine  She has had COVID twice in the past She has been vaccinated for COVID with boosters    Current Medication:   Current Outpatient Medications:    cetirizine (ZYRTEC) 10 MG chewable tablet, Chew 10 mg by mouth daily., Disp: , Rfl:    Probiotic Product (PROBIOTIC DAILY PO), Take by mouth., Disp: , Rfl:       Medical History: Past Medical History:  Diagnosis Date   Fibroids    Hypertension      Vital Signs: BP 121/83   Pulse 94   Temp 98.2 F (36.8 C) (Tympanic)   Wt 160 lb (72.6 kg)   SpO2 97%   BMI 25.82 kg/m    Review of Systems  Constitutional:  Positive for fatigue and fever.  HENT:  Positive for rhinorrhea.   Respiratory: Negative.    Gastrointestinal: Negative.   Genitourinary: Negative.   Musculoskeletal: Negative.   Neurological:  Positive for headaches.    Physical Exam Constitutional:      Appearance: She is ill-appearing.  HENT:     Head: Normocephalic.     Right Ear: Tympanic membrane, ear canal and external ear normal.     Left Ear: Tympanic membrane, ear canal and external ear normal.     Nose: Congestion present.     Mouth/Throat:     Pharynx: Posterior oropharyngeal erythema present. No oropharyngeal exudate.  Eyes:     Pupils: Pupils are equal, round, and reactive to light.  Cardiovascular:     Rate and Rhythm: Normal rate.     Heart sounds: Normal heart  sounds.  Pulmonary:     Effort: Pulmonary effort is normal.     Breath sounds: Normal breath sounds.  Neurological:     General: No focal deficit present.     Mental Status: She is alert.  Psychiatric:        Mood and Affect: Mood normal.     Recent Results (from the past 2160 hour(s))  Pregnancy, urine POC     Status: None   Collection Time: 04/27/22 10:01 AM  Result Value Ref Range   Preg Test, Ur NEGATIVE NEGATIVE    Comment:        THE SENSITIVITY OF THIS METHODOLOGY IS >24 mIU/mL   Surgical pathology     Status: None   Collection Time: 04/27/22 10:47 AM  Result Value Ref Range   SURGICAL PATHOLOGY      SURGICAL PATHOLOGY CASE: [id number] PATIENT: De Nurse Surgical Pathology Report     Specimen Submitted: A. Colon polyp x2, descending; cbx  Clinical History: Screening colonoscopy      DIAGNOSIS: A.  COLON, DESCENDING, POLYPS BIOPSY- - POLYPOID HYPERPLASTIC BENIGN COLONIC MUCOSA. - NO DYSPLASIA. - FEATURES OF A SPECIFIC POLYP ARE NOT SEEN. - SPECIMEN REVIEWED OVER ADDITIONAL LEVELS.  GROSS DESCRIPTION: A. Labeled:  Descending colon polyp cold biopsy x 2 Received: Formalin Collection time: 10:47 AM on 04/27/2022 Placed into formalin time: 10:47 AM on 04/27/2022 Tissue fragment(s): 2 Size: Range from 0.4-0.5 cm Description: Tan soft tissue fragments Entirely submitted in 1 cassette.  RB 04/27/2022   Final Diagnosis performed by Theodora Blow, MD.   Electronically signed 04/30/2022 3:59:56PM The electronic signature indicates that the named Attending Pathologist has evaluated the specimen Technical component performed at [facility], [address], [place] [address] Lab: [phone] Dir: Rush Farmer, MD, MMM  Professional component performed at [facility], [place], [place], [place] [address] Lab: [phone] Dir: Kathi Simpers, MD   POC  2 FLU + SARS ANTIGEN FIA     Status: Normal   Collection  Time: 07/25/22  8:53 AM  Result Value Ref Range   Influenza A, POC Negative Negative   Influenza B, POC Negative Negative   SARS Coronavirus 2 Ag Negative Negative  POCT rapid strep A     Status: Normal   Collection Time: 07/25/22  8:53 AM  Result Value Ref Range   Rapid Strep A Screen Negative Negative     Assessment/Plan:  1. Fever in other diseases Continue to alternate ibuprofen and tylenol as needed for fever and HA Repeat COVID testing tomorrow   - POCT rapid strep A negative - POC  2 FLU + SARS ANTIGEN FIA negative   2. Viral upper respiratory tract infection OTC decongestant cold/flu medication for symptom management       General Counseling: zeya balles understanding of the findings of todays visit and agrees with plan of treatment. I have discussed any further diagnostic evaluation that may be needed or ordered today. We also reviewed her medications today. she has been encouraged to call the office with any questions or concerns that should arise related to todays visit.   Time spent:[address] [place] [facility] Family Nurse Practitioner

## 2022-08-28 ENCOUNTER — Encounter: Payer: Self-pay | Admitting: Obstetrics and Gynecology

## 2022-08-28 ENCOUNTER — Ambulatory Visit (INDEPENDENT_AMBULATORY_CARE_PROVIDER_SITE_OTHER): Payer: BC Managed Care – PPO | Admitting: Obstetrics and Gynecology

## 2022-08-28 VITALS — BP 159/93 | HR 70 | Ht 66.0 in | Wt 165.5 lb

## 2022-08-28 DIAGNOSIS — Z01419 Encounter for gynecological examination (general) (routine) without abnormal findings: Secondary | ICD-10-CM

## 2022-08-28 NOTE — Progress Notes (Signed)
Patients presents for annual exam today. She states having irregular cycles over the past few months, usually will skip every other month. Patient is up to date on pap smear and mammogram. Annual labs are deferred to PCP next month.  Patient states no other questions or concerns at this time.

## 2022-08-28 NOTE — Progress Notes (Signed)
HPI:      [name] is a 52 y.o. [id number] who LMP was Patient's last menstrual period was 08/17/2022 (exact date).  Subjective:   She presents today for her annual examination.  She says she is generally doing well.  She has regular menses but occasionally "skipped 1 or 2".  She denies hot flashes or night sweats.  She denies vaginal dryness.  She does state that she occasionally leaks a few drops of urine and has gotten a little bit worse in the last year but at this point "does not want to do anything about it.    Hx: The following portions of the patient's history were reviewed and updated as appropriate:             She  has a past medical history of Fibroids and Hypertension. She does not have any pertinent problems on file. She  has a past surgical history that includes Tubal ligation; Cesarean section; Hernia repair; Colonoscopy with propofol (N/A, 04/27/2022); and polypectomy (04/27/2022). Her family history includes Breast cancer (age of onset: 14) in her mother; Uterine cancer (age of onset: 46) in her paternal aunt. She  reports that she has never smoked. She has never been exposed to tobacco smoke. She has never used smokeless tobacco. She reports that she does not drink alcohol and does not use drugs. She has a current medication list which includes the following prescription(s): cetirizine and probiotic product. She is allergic to codeine.       Review of Systems:  Review of Systems  Constitutional: Denied constitutional symptoms, night sweats, recent illness, fatigue, fever, insomnia and weight loss.  Eyes: Denied eye symptoms, eye pain, photophobia, vision change and visual disturbance.  Ears/Nose/Throat/Neck: Denied ear, nose, throat or neck symptoms, hearing loss, nasal discharge, sinus congestion and sore throat.  Cardiovascular: Denied cardiovascular symptoms, arrhythmia, chest pain/pressure, edema, exercise intolerance, orthopnea and palpitations.  Respiratory:  Denied pulmonary symptoms, asthma, pleuritic pain, productive sputum, cough, dyspnea and wheezing.  Gastrointestinal: Denied, gastro-esophageal reflux, melena, nausea and vomiting.  Genitourinary: See HPI for additional information.  Musculoskeletal: Denied musculoskeletal symptoms, stiffness, swelling, muscle weakness and myalgia.  Dermatologic: Denied dermatology symptoms, rash and scar.  Neurologic: Denied neurology symptoms, dizziness, headache, neck pain and syncope.  Psychiatric: Denied psychiatric symptoms, anxiety and depression.  Endocrine: Denied endocrine symptoms including hot flashes and night sweats.   Meds:   Current Outpatient Medications on File Prior to Visit  Medication Sig Dispense Refill   cetirizine (ZYRTEC) 10 MG chewable tablet Chew 10 mg by mouth daily.     Probiotic Product (PROBIOTIC DAILY PO) Take by mouth.     No current facility-administered medications on file prior to visit.     Objective:     Vitals:   08/28/22 1431 08/28/22 1437  BP: (!) 167/99 (!) 159/93  Pulse: (!) 58 70    Filed Weights   08/28/22 1431  Weight: 165 lb 8 oz (75.1 kg)              Physical examination General NAD, Conversant  HEENT Atraumatic; Op clear with mmm.  Normo-cephalic. Pupils reactive. Anicteric sclerae  Thyroid/Neck Smooth without nodularity or enlargement. Normal ROM.  Neck Supple.  Skin No rashes, lesions or ulceration. Normal palpated skin turgor. No nodularity.  Breasts: No masses or discharge.  Symmetric.  No axillary adenopathy.  Lungs: Clear to auscultation.No rales or wheezes. Normal Respiratory effort, no retractions.  Heart: NSR.  No murmurs or rubs appreciated. No  peripheral edema  Abdomen: Soft.  Non-tender.  No masses.  No HSM. No hernia  Extremities: Moves all appropriately.  Normal ROM for age. No lymphadenopathy.  Neuro: Oriented to PPT.  Normal mood. Normal affect.     Pelvic:   Vulva: Normal appearance.  No lesions.  Vagina: No lesions or  abnormalities noted.  Support: Normal pelvic support.  Urethra No masses tenderness or scarring.  Meatus Normal size without lesions or prolapse.  Cervix: Normal appearance.  No lesions.  Anus: Normal exam.  No lesions.  Perineum: Normal exam.  No lesions.        Bimanual   Uterus: Normal size.  Non-tender.  Mobile.  AV.  Adnexae: No masses.  Non-tender to palpation.  Cul-de-sac: Negative for abnormality.     Assessment:    [id number] Patient Active Problem List   Diagnosis Date Noted   Encounter for screening colonoscopy    Polyp of descending colon    Family history of breast cancer 04/01/2017   Family history of ovarian cancer 04/01/2017     1. Well woman exam with routine gynecological exam     Some minor SUI but patient not concerned at this time  Occasionally skips a menstrual period but no signs and symptoms of menopause-likely perimenopausal based on age and symptoms.   Plan:            1.  Basic Screening Recommendations The basic screening recommendations for asymptomatic women were discussed with the patient during her visit.  The age-appropriate recommendations were discussed with her and the rational for the tests reviewed.  When I am informed by the patient that another primary care physician has previously obtained the age-appropriate tests and they are up-to-date, only outstanding tests are ordered and referrals given as necessary.  Abnormal results of tests will be discussed with her when all of her results are completed.  Routine preventative health maintenance measures emphasized: Exercise/Diet/Weight control, Tobacco Warnings, Alcohol/Substance use risks and Stress Management Patient advised to get her blood pressure rechecked at her new primary care physician in less than 1 month. Discussed signs and symptoms of menopause and patient to let us know if she begins having them or begins regularly missing menses. SUI discussed.  Orders No orders of the defined  types were placed in this encounter.   No orders of the defined types were placed in this encounter.           F/U  Return in about 1 year (around 08/29/2023) for Annual Physical.  Finis Bud, M.D. 08/28/2022 3:07 PM

## 2022-12-10 ENCOUNTER — Ambulatory Visit (INDEPENDENT_AMBULATORY_CARE_PROVIDER_SITE_OTHER): Payer: Self-pay | Admitting: Physician Assistant

## 2022-12-10 VITALS — BP 124/84 | HR 91 | Temp 97.9°F | Resp 16 | Wt 163.0 lb

## 2022-12-10 DIAGNOSIS — R051 Acute cough: Secondary | ICD-10-CM

## 2022-12-10 DIAGNOSIS — J0110 Acute frontal sinusitis, unspecified: Secondary | ICD-10-CM

## 2022-12-10 MED ORDER — BENZONATATE 200 MG PO CAPS
200.0000 mg | ORAL_CAPSULE | Freq: Three times a day (TID) | ORAL | 0 refills | Status: DC | PRN
Start: 2022-12-10 — End: 2024-03-02

## 2022-12-10 MED ORDER — AMOXICILLIN-POT CLAVULANATE 875-125 MG PO TABS
1.0000 | ORAL_TABLET | Freq: Two times a day (BID) | ORAL | 0 refills | Status: AC
Start: 2022-12-10 — End: 2022-12-17

## 2022-12-10 MED ORDER — METHYLPREDNISOLONE 4 MG PO TBPK
ORAL_TABLET | ORAL | 0 refills | Status: DC
Start: 2022-12-10 — End: 2024-03-02

## 2022-12-10 NOTE — Progress Notes (Signed)
[facility] [address] [place], [place] [address]   Office Visit Note  Patient Name: Cassidy Wright Date of Birth [id number]  Medical Record number [id number]  Date of Service: 12/10/2022  Chief Complaint  Patient presents with   URI     52 y/o F presents to the clinic for c/o cough, sinus pressure and pain, low grade fever with max temp of 100.3 degrees F, headache x 5 days. Started with post tussive chest pain since yesterday. Denies SOB, wheezing. +h/o allergies for which she takes Zyrtec.       Current Medication:  Outpatient Encounter Medications as of 12/10/2022  Medication Sig   amoxicillin-clavulanate (AUGMENTIN) 875-125 MG tablet Take 1 tablet by mouth 2 (two) times daily for 7 days.   benzonatate (TESSALON) 200 MG capsule Take 1 capsule (200 mg total) by mouth 3 (three) times daily as needed for cough.   cetirizine (ZYRTEC) 10 MG chewable tablet Chew 10 mg by mouth daily.   lisinopril-hydrochlorothiazide (ZESTORETIC) 20-12.5 MG tablet Take by mouth.   methylPREDNISolone (MEDROL DOSEPAK) 4 MG TBPK tablet Take as directed   Probiotic Product (PROBIOTIC DAILY PO) Take by mouth.   No facility-administered encounter medications on file as of 12/10/2022.      Medical History: Past Medical History:  Diagnosis Date   Fibroids    Hypertension      Vital Signs: BP 124/84 (BP Location: Left Arm, Patient Position: Sitting, Cuff Size: Normal)   Pulse 91   Temp 97.9 F (36.6 C) (Tympanic)   Resp 16   Wt 163 lb (73.9 kg)   SpO2 98%   BMI 26.31 kg/m    Review of Systems  Constitutional:  Positive for fever. Negative for chills.  HENT:  Positive for congestion, postnasal drip, sinus pressure and sinus pain. Negative for sore throat and trouble swallowing.   Respiratory:  Positive for cough and chest tightness. Negative for shortness of breath and wheezing.   Cardiovascular: Negative.   Neurological:  Positive for headaches. Negative for dizziness and  light-headedness.    Physical Exam Constitutional:      Appearance: Normal appearance.  HENT:     Head: Atraumatic.     Right Ear: Ear canal and external ear normal.     Left Ear: Tympanic membrane, ear canal and external ear normal.     Ears:     Comments: Partially occluded R ear with cerumen, but TM partially visualized appears normal.     Nose:     Right Sinus: Frontal sinus tenderness present. No maxillary sinus tenderness.     Left Sinus: Frontal sinus tenderness present. No maxillary sinus tenderness.     Mouth/Throat:     Mouth: Mucous membranes are moist.     Pharynx: Oropharynx is clear.  Eyes:     Extraocular Movements: Extraocular movements intact.  Cardiovascular:     Rate and Rhythm: Normal rate and regular rhythm.  Pulmonary:     Effort: Pulmonary effort is normal.     Breath sounds: Normal breath sounds.  Musculoskeletal:     Cervical back: Neck supple.  Skin:    General: Skin is warm.  Neurological:     Mental Status: She is alert.  Psychiatric:        Mood and Affect: Mood normal.        Behavior: Behavior normal.        Thought Content: Thought content normal.        Judgment: Judgment normal.  Assessment/Plan:  1. Acute non-recurrent frontal sinusitis - amoxicillin-clavulanate (AUGMENTIN) 875-125 MG tablet; Take 1 tablet by mouth 2 (two) times daily for 7 days.  Dispense: 14 tablet; Refill: 0 - methylPREDNISolone (MEDROL DOSEPAK) 4 MG TBPK tablet; Take as directed  Dispense: 1 each; Refill: 0  2. Acute cough - benzonatate (TESSALON) 200 MG capsule; Take 1 capsule (200 mg total) by mouth 3 (three) times daily as needed for cough.  Dispense: 30 capsule; Refill: 0  Increase fluids Take medicines as prescribed Deferred oral decongestant because of h/o HTN Continue to watch for worsening symptoms RTC prn Pt verbalized understanding and in agreement.    General Counseling: keslie saile understanding of the findings of todays visit and  agrees with plan of treatment. I have discussed any further diagnostic evaluation that may be needed or ordered today. We also reviewed her medications today. she has been encouraged to call the office with any questions or concerns that should arise related to todays visit.    Time spent:20 Minutes    Gilberto Better, [place] Physician Assistant

## 2023-06-04 ENCOUNTER — Other Ambulatory Visit: Payer: Self-pay | Admitting: Internal Medicine

## 2023-06-04 DIAGNOSIS — Z1231 Encounter for screening mammogram for malignant neoplasm of breast: Secondary | ICD-10-CM

## 2023-07-22 ENCOUNTER — Ambulatory Visit: Payer: BC Managed Care – PPO

## 2023-08-16 ENCOUNTER — Ambulatory Visit
Admission: RE | Admit: 2023-08-16 | Discharge: 2023-08-16 | Disposition: A | Discharge status: Home / Self Care | Payer: BC Managed Care – PPO | Source: Ambulatory Visit | Attending: Internal Medicine | Admitting: Internal Medicine

## 2023-08-16 DIAGNOSIS — Z1231 Encounter for screening mammogram for malignant neoplasm of breast: Secondary | ICD-10-CM

## 2024-03-02 ENCOUNTER — Ambulatory Visit (INDEPENDENT_AMBULATORY_CARE_PROVIDER_SITE_OTHER): Payer: Self-pay | Admitting: Medical

## 2024-03-02 ENCOUNTER — Encounter: Payer: Self-pay | Admitting: Medical

## 2024-03-02 VITALS — BP 109/73 | HR 86 | Temp 99.5°F

## 2024-03-02 DIAGNOSIS — J029 Acute pharyngitis, unspecified: Secondary | ICD-10-CM

## 2024-03-02 DIAGNOSIS — R55 Syncope and collapse: Secondary | ICD-10-CM

## 2024-03-02 DIAGNOSIS — U071 COVID-19: Secondary | ICD-10-CM

## 2024-03-02 LAB — POC COVID19/FLU A&B COMBO
Covid Antigen, POC: POSITIVE — AB
Influenza A Antigen, POC: NEGATIVE
Influenza B Antigen, POC: NEGATIVE

## 2024-03-02 LAB — POCT RAPID STREP A (OFFICE): Rapid Strep A Screen: NEGATIVE

## 2024-03-02 NOTE — Progress Notes (Unsigned)
 [facility] [address] [place], [place] [address]   Office Visit Note  Patient Name: Cassidy Wright Date of Birth [id number]  Medical Record number [id number]  Date of Service: 03/02/2024  Chief Complaint  Patient presents with   Acute Visit    Trip to [facility], son had strep, complains of fever started Saturday, cough started Sunday.  States they flew for their trip.  States no concern for covid     HPI 53 y.o. female presents with fever and respiratory symptoms x 2 days.   Sx began with subjective fever and fatigue 2 nights ago.  Fever of 101.5 yesterday AM, 99.5 this morning.  Also has nasal congestion, runny nose, headache, sore throat and cough (mostly dry). Sore throat not severe, mild discomfort when swallowing. Denies nausea, vomiting, diarrhea.   Patient states she blacked out yesterday afternoon. Does not recall this, but husband states he heard a fall and found her in bathtub (was not bathing at the time). Patient states does not know if she hit head, denies bump or bruise). No neck pain or stiffness. LOC suspected to be only a few seconds. Denies chest pain or SOB.   Has taken Advil and Nyquil.  Son had strep throat last week.    Current Medication:  Outpatient Encounter Medications as of 03/02/2024  Medication Sig   cetirizine (ZYRTEC) 10 MG chewable tablet Chew 10 mg by mouth daily.   lisinopril-hydrochlorothiazide (ZESTORETIC) 20-12.5 MG tablet Take by mouth.   Probiotic Product (PROBIOTIC DAILY PO) Take by mouth.   [DISCONTINUED] benzonatate  (TESSALON ) 200 MG capsule Take 1 capsule (200 mg total) by mouth 3 (three) times daily as needed for cough.   [DISCONTINUED] methylPREDNISolone  (MEDROL  DOSEPAK) 4 MG TBPK tablet Take as directed   No facility-administered encounter medications on file as of 03/02/2024.      Medical History: Past Medical History:  Diagnosis Date   Fibroids    Hypertension      Vital Signs: BP 109/73   Pulse 86    Temp 99.5 F (37.5 C) (Tympanic)   SpO2 96%    Review of Systems  Physical Exam Vitals reviewed.  Constitutional:      General: She is not in acute distress.    Comments: Tired, mildly unwell appearing  HENT:     Head: Normocephalic.     Right Ear: Ear canal and external ear normal.     Left Ear: Ear canal and external ear normal.     Ears:     Comments: TMs slightly dull    Nose: Mucosal edema, congestion and rhinorrhea present. Rhinorrhea is clear.     Mouth/Throat:     Mouth: Mucous membranes are moist. No oral lesions.     Pharynx: Posterior oropharyngeal erythema (mild) present. No pharyngeal swelling.     Tonsils: No tonsillar exudate. 0 on the right. 0 on the left.  Cardiovascular:     Rate and Rhythm: Normal rate and regular rhythm.     Heart sounds: No murmur heard.    No friction rub. No gallop.  Pulmonary:     Effort: Pulmonary effort is normal.     Breath sounds: Normal breath sounds. No wheezing, rhonchi or rales.  Musculoskeletal:     Cervical back: Neck supple. No rigidity.  Lymphadenopathy:     Cervical: Cervical adenopathy (small anterior nodes bilaterally) present.  Neurological:     Mental Status: She is alert.     Results for orders placed or performed  in visit on 03/02/24 (from the past 24 hours)  POCT rapid strep A     Status: Normal   Collection Time: 03/02/24 10:04 AM  Result Value Ref Range   Rapid Strep A Screen Negative Negative  POC Covid19/Flu A&B Antigen     Status: Abnormal   Collection Time: 03/02/24 10:04 AM  Result Value Ref Range   Influenza A Antigen, POC Negative Negative   Influenza B Antigen, POC Negative Negative   Covid Antigen, POC Positive (A) Negative     Assessment/Plan: 1. COVID-19 (Primary) 2. Pharyngitis, unspecified etiology You should wear a well-fitting mask (ideally a N95 or KN95) anytime you must be outside your room for the initial 5 days of symptoms. After the initial 5 days, you may resume activities as  you feel appropriate as long as you continue to wear a mask.  Rest, and drink plenty of water .  Use cough drops, gargle warm salt water  or drink wam liquids (like tea with honey) as needed for cough/throat irritation.   Take over-the-counter medicines (such as Coricidin HBP Cold and Flu medicine) as discussed at your visit to help manage your symptoms.  Send a MyChart message to the provider or schedule a return appointment as needed for new/worsening symptoms (especially shortness of breath or chest pain) or if symptoms not improving with recommended treatment over the next 5-7 days.     - POCT rapid strep A - POC Covid19/Flu A&B Antigen     General Counseling: leilana mcquire understanding of the findings of todays visit and agrees with plan of treatment. I have discussed any further diagnostic evaluation that may be needed or ordered today. We also reviewed her medications today. she has been encouraged to call the office with any questions or concerns that should arise related to todays visit.   Orders Placed This Encounter  Procedures   POCT rapid strep A    No orders of the defined types were placed in this encounter.   Time spent:*** Minutes    Joen Arts PA-C

## 2024-03-03 NOTE — Patient Instructions (Addendum)
 You have tested positive for COVID-19. It is recommended that you limit close contact with others and do not attend work or other activities until your fever has resolved for at least 24 hours (without taking fever-reducing medicine). I recommend wearing a well-fitting mask (surgical mask or N95) when around others (i.e. when visiting public places or when in close contact with family) until your symptoms resolve.   Rest and drink plenty of water .  Avoid alcohol, limit caffeine. Use cough drops, gargle warm salt water  or drink wam liquids (like tea with honey) as needed for cough/throat irritation.  Take over-the-counter medicines (such as Coricidin HBP Cold and Flu medicine) as discussed at your visit to help manage your symptoms.   Since there is a chance you hit your head when you passed out, please monitor yourself or ask your loved ones to monitor for signs of a concussion (i.e. worsening HA, repeated fainting, persistent nausea/vomiting, difficulty concentrating, light sensitivity, vision changes, sleep problems) the next few days. If these develop, please schedule a follow up visit or if severe, go to the emergency department.  Send a message to the provider, consult your primary care provider or schedule a return appointment as needed for new/worsening symptoms (especially shortness of breath or chest pain) or if your symptoms not improving with recommended treatment over the next 5-7 days.

## 2024-05-29 ENCOUNTER — Other Ambulatory Visit: Payer: Self-pay | Admitting: Family Medicine

## 2024-05-29 DIAGNOSIS — Z1231 Encounter for screening mammogram for malignant neoplasm of breast: Secondary | ICD-10-CM

## 2024-08-17 ENCOUNTER — Ambulatory Visit
Admission: RE | Admit: 2024-08-17 | Discharge: 2024-08-17 | Disposition: A | Source: Ambulatory Visit | Attending: Family Medicine | Admitting: Family Medicine

## 2024-08-17 DIAGNOSIS — Z1231 Encounter for screening mammogram for malignant neoplasm of breast: Secondary | ICD-10-CM

## 2024-08-21 ENCOUNTER — Ambulatory Visit: Payer: Self-pay | Admitting: Obstetrics and Gynecology
